# Patient Record
Sex: Female | Born: 1990 | Race: White | Hispanic: No | Marital: Married | Smoking: Never smoker
Health system: Southern US, Community
[De-identification: ages and names within clinical notes are randomized; demographics above are authoritative.]

## PROBLEM LIST (undated history)

## (undated) ENCOUNTER — Inpatient Hospital Stay: Payer: Self-pay

## (undated) DIAGNOSIS — Z789 Other specified health status: Secondary | ICD-10-CM

## (undated) DIAGNOSIS — Z1507 Genetic susceptibility to malignant neoplasm of urinary tract: Secondary | ICD-10-CM

## (undated) DIAGNOSIS — F32A Depression, unspecified: Secondary | ICD-10-CM

## (undated) DIAGNOSIS — Z1501 Genetic susceptibility to malignant neoplasm of breast: Secondary | ICD-10-CM

---
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
## 2010-07-10 ENCOUNTER — Ambulatory Visit (HOSPITAL_COMMUNITY)
Admission: RE | Admit: 2010-07-10 | Discharge: 2010-07-10 | Payer: Self-pay | Source: Home / Self Care | Attending: Family Medicine | Admitting: Family Medicine

## 2012-09-13 ENCOUNTER — Ambulatory Visit: Payer: Self-pay | Admitting: Nurse Practitioner

## 2012-09-16 ENCOUNTER — Encounter: Payer: Self-pay | Admitting: Nurse Practitioner

## 2012-09-16 ENCOUNTER — Ambulatory Visit (INDEPENDENT_AMBULATORY_CARE_PROVIDER_SITE_OTHER): Payer: BLUE CROSS/BLUE SHIELD | Admitting: Nurse Practitioner

## 2012-09-16 VITALS — BP 102/70 | HR 70 | Temp 98.8°F

## 2012-09-16 DIAGNOSIS — A499 Bacterial infection, unspecified: Secondary | ICD-10-CM

## 2012-09-16 DIAGNOSIS — N760 Acute vaginitis: Secondary | ICD-10-CM

## 2012-09-16 DIAGNOSIS — B9689 Other specified bacterial agents as the cause of diseases classified elsewhere: Secondary | ICD-10-CM

## 2012-09-16 DIAGNOSIS — N898 Other specified noninflammatory disorders of vagina: Secondary | ICD-10-CM

## 2012-09-16 LAB — POCT URINALYSIS DIPSTICK: pH, UA: 8

## 2012-09-16 LAB — POCT WET PREP (WET MOUNT): KOH Wet Prep POC: NEGATIVE

## 2012-09-16 MED ORDER — METRONIDAZOLE 500 MG PO TABS: ORAL_TABLET | ORAL | Status: DC

## 2012-09-16 NOTE — Patient Instructions (Signed)
Bacterial Vaginosis Bacterial vaginosis (BV) is a vaginal infection where the normal balance of bacteria in the vagina is disrupted. The normal balance is then replaced by an overgrowth of certain bacteria. There are several different kinds of bacteria that can cause BV. BV is the most common vaginal infection in women of childbearing age. CAUSES   The cause of BV is not fully understood. BV develops when there is an increase or imbalance of harmful bacteria.  Some activities or behaviors can upset the normal balance of bacteria in the vagina and put women at increased risk including:  Having a new sex partner or multiple sex partners.  Douching.  Using an intrauterine device (IUD) for contraception.  It is not clear what role sexual activity plays in the development of BV. However, women that have never had sexual intercourse are rarely infected with BV. Women do not get BV from toilet seats, bedding, swimming pools or from touching objects around them.  SYMPTOMS   Grey vaginal discharge.  A fish-like odor with discharge, especially after sexual intercourse.  Itching or burning of the vagina and vulva.  Burning or pain with urination.  Some women have no signs or symptoms at all. DIAGNOSIS  Your caregiver must examine the vagina for signs of BV. Your caregiver will perform lab tests and look at the sample of vaginal fluid through a microscope. They will look for bacteria and abnormal cells (clue cells), a pH test higher than 4.5, and a positive amine test all associated with BV.  RISKS AND COMPLICATIONS   Pelvic inflammatory disease (PID).  Infections following gynecology surgery.  Developing HIV.  Developing herpes virus. TREATMENT  Sometimes BV will clear up without treatment. However, all women with symptoms of BV should be treated to avoid complications, especially if gynecology surgery is planned. Female partners generally do not need to be treated. However, BV may spread  between female sex partners so treatment is helpful in preventing a recurrence of BV.   BV may be treated with antibiotics. The antibiotics come in either pill or vaginal cream forms. Either can be used with nonpregnant or pregnant women, but the recommended dosages differ. These antibiotics are not harmful to the baby.  BV can recur after treatment. If this happens, a second round of antibiotics will often be prescribed.  Treatment is important for pregnant women. If not treated, BV can cause a premature delivery, especially for a pregnant woman who had a premature birth in the past. All pregnant women who have symptoms of BV should be checked and treated.  For chronic reoccurrence of BV, treatment with a type of prescribed gel vaginally twice a week is helpful. HOME CARE INSTRUCTIONS   Finish all medication as directed by your caregiver.  Do not have sex until treatment is completed.  Tell your sexual partner that you have a vaginal infection. They should see their caregiver and be treated if they have problems, such as a mild rash or itching.  Practice safe sex. Use condoms. Only have 1 sex partner. PREVENTION  Basic prevention steps can help reduce the risk of upsetting the natural balance of bacteria in the vagina and developing BV:  Do not have sexual intercourse (be abstinent).  Do not douche.  Use all of the medicine prescribed for treatment of BV, even if the signs and symptoms go away.  Tell your sex partner if you have BV. That way, they can be treated, if needed, to prevent reoccurrence. SEEK MEDICAL CARE IF:     Your symptoms are not improving after 3 days of treatment.  You have increased discharge, pain, or fever. MAKE SURE YOU:   Understand these instructions.  Will watch your condition.  Will get help right away if you are not doing well or get worse. FOR MORE INFORMATION  Division of STD Prevention (DSTDP), Centers for Disease Control and Prevention:  www.cdc.gov/std American Social Health Association (ASHA): www.ashastd.org  Document Released: 06/15/2005 Document Revised: 09/07/2011 Document Reviewed: 12/06/2008 ExitCare Patient Information 2013 [employer].  

## 2012-09-16 NOTE — Progress Notes (Signed)
Subjective: Presents complaints of slight vaginal discharge with faint greenish color at times. Occasional itching. No fever. No pelvic pain. Married, same sexual partner. No dysuria. Was treated for BV on 2/19 with MetroGel. Symptoms had improved. Objective: NAD. Alert, oriented. Lungs clear. Heart regular rate rhythm. Abdomen soft nondistended nontender. EGBUS normal limit. Vagina minimal amount of white mucoid discharge noted. No CMT. Uterus and adnexa normal limit and nontender. Wet prep pH 4.5 with occasional clue cell noted. Assessment:Bacterial vaginosis - Plan: POCT Wet Prep [facility])  Vaginal discharge - Plan: POCT urinalysis dipstick, POCT Wet Prep [facility] [place])  Plan: Meds ordered this encounter  Medications  . DISCONTD: metroNIDAZOLE (METROGEL) 0.75 % vaginal gel    Sig:   . metroNIDAZOLE (FLAGYL) 500 MG tablet    Sig: Take one twice a day for 7 days.    Dispense:  14 tablet    Refill:  0    Order Specific Question:  Supervising Provider    Answer:  Merlyn Albert [[id number]]   call back if symptoms worsen or persist.

## 2013-06-27 ENCOUNTER — Telehealth: Payer: Self-pay | Admitting: Nurse Practitioner

## 2013-06-27 NOTE — Telephone Encounter (Signed)
Has appt Feb. 27, for pe with Eber Jones.  Is now taking Tri-Sprintec birth control pills.  Could you call her in enough to last her until she is seen. [facility].

## 2013-06-28 ENCOUNTER — Other Ambulatory Visit: Payer: Self-pay | Admitting: Nurse Practitioner

## 2013-06-28 MED ORDER — NORGESTIM-ETH ESTRAD TRIPHASIC 0.18/0.215/0.25 MG-35 MCG PO TABS: 1.0000 | ORAL_TABLET | Freq: Every day | ORAL | Status: DC

## 2013-08-25 ENCOUNTER — Encounter: Payer: BLUE CROSS/BLUE SHIELD | Admitting: Nurse Practitioner

## 2013-09-20 ENCOUNTER — Encounter: Payer: Self-pay | Admitting: Nurse Practitioner

## 2013-09-20 ENCOUNTER — Encounter: Payer: Self-pay | Admitting: Family Medicine

## 2013-09-20 ENCOUNTER — Ambulatory Visit (INDEPENDENT_AMBULATORY_CARE_PROVIDER_SITE_OTHER): Payer: BLUE CROSS/BLUE SHIELD | Admitting: Nurse Practitioner

## 2013-09-20 VITALS — BP 122/80 | Ht 61.25 in | Wt 115.5 lb

## 2013-09-20 DIAGNOSIS — Z0000 Encounter for general adult medical examination without abnormal findings: Secondary | ICD-10-CM

## 2013-09-20 DIAGNOSIS — Z113 Encounter for screening for infections with a predominantly sexual mode of transmission: Secondary | ICD-10-CM

## 2013-09-20 DIAGNOSIS — Z01419 Encounter for gynecological examination (general) (routine) without abnormal findings: Secondary | ICD-10-CM

## 2013-09-20 MED ORDER — NORGESTIM-ETH ESTRAD TRIPHASIC 0.18/0.215/0.25 MG-35 MCG PO TABS: 1.0000 | ORAL_TABLET | Freq: Every day | ORAL | Status: DC

## 2013-09-20 NOTE — Patient Instructions (Signed)
Check Gardasil 9   Human Papillomavirus Vaccine, Quadrivalent What is this medicine? HUMAN PAPILLOMAVIRUS VACCINE (HYOO muhn pap uh LOH muh vahy ruhs vak SEEN) is a vaccine. It is used to prevent infections of four types of the human papillomavirus. In women, the vaccine may lower your risk of getting cervical, vaginal, or anal cancer and genital warts. In men, the vaccine may lower your risk of getting genital warts and anal cancer. You cannot get these diseases from the vaccine. This vaccine does not treat these diseases. This medicine may be used for other purposes; ask your health care provider or pharmacist if you have questions. COMMON BRAND NAME(S): Gardasil What should I tell my health care provider before I take this medicine? They need to know if you have any of these conditions: -fever or infection -hemophilia -HIV infection or AIDS -immune system problems -low platelet count -an unusual reaction to Human Papillomavirus Vaccine, yeast, other medicines, foods, dyes, or preservatives -pregnant or trying to get pregnant -breast-feeding How should I use this medicine? This vaccine is for injection in a muscle on your upper arm or thigh. It is given by a health care professional. Dennis Bast will be observed for 15 minutes after each dose. Sometimes, fainting happens after the vaccine is given. You may be asked to sit or lie down during the 15 minutes. Three doses are given. The second dose is given 2 months after the first dose. The last dose is given 4 months after the second dose. A copy of a Vaccine Information Statement will be given before each vaccination. Read this sheet carefully each time. The sheet may change frequently. Talk to your pediatrician regarding the use of this medicine in children. While this drug may be prescribed for children as young as 13 years of age for selected conditions, precautions do apply. Overdosage: If you think you have taken too much of this medicine contact  a poison control center or emergency room at once. NOTE: This medicine is only for you. Do not share this medicine with others. What if I miss a dose? All 3 doses of the vaccine should be given within 6 months. Remember to keep appointments for follow-up doses. Your health care provider will tell you when to return for the next vaccine. Ask your health care professional for advice if you are unable to keep an appointment or miss a scheduled dose. What may interact with this medicine? -medicines that suppress your immune system like some medicines for cancer -steroid medicines like prednisone or cortisone -other vaccines This list may not describe all possible interactions. Give your health care provider a list of all the medicines, herbs, non-prescription drugs, or dietary supplements you use. Also tell them if you smoke, drink alcohol, or use illegal drugs. Some items may interact with your medicine. What should I watch for while using this medicine? This vaccine may not fully protect everyone. Continue to have regular pelvic exams and cervical or anal cancer screenings as directed by your doctor. The Human Papillomavirus is a sexually transmitted disease. It can be passed by any kind of sexual activity that involves genital contact. The vaccine works best when given before you have any contact with the virus. Many people who have the virus do not have any signs or symptoms. Tell your doctor or health care professional if you have any reaction or unusual symptom after getting the vaccine. What side effects may I notice from receiving this medicine? Side effects that you should report to your  doctor or health care professional as soon as possible: -allergic reactions like skin rash, itching or hives, swelling of the face, lips, or tongue -breathing problems -feeling faint or lightheaded, falls Side effects that usually do not require medical attention (report to your doctor or health care  professional if they continue or are bothersome): -cough -fever -redness, warmth, swelling, pain, or itching at site where injected This list may not describe all possible side effects. Call your doctor for medical advice about side effects. You may report side effects to FDA at 1-800-FDA-1088. Where should I keep my medicine? This drug is given in a hospital or clinic and will not be stored at home. NOTE: This sheet is a summary. It may not cover all possible information. If you have questions about this medicine, talk to your doctor, pharmacist, or health care provider.  2014, Elsevier/Gold Standard. (2009-06-20 11:52:23) Human Papillomavirus Human papillomavirus (HPV) is the most common sexually transmitted infection (STI) and is highly contagious. HPV infections cause genital warts and cancers to the outlet of the womb (cervix), birth canal (vagina), opening of the birth canal (vulva), and anus. There are over 100 types of HPV. Four types of HPV are responsible for causing 70% of all cervical cancers. Ninety percent of anal cancers and genital warts are caused by HPV. Unless you have wart-like lesions in the throat or genital warts that you can see or feel, HPV usually does not cause symptoms. Therefore, people can be infected for long periods and pass it on to others without knowing it. HPV in pregnancy usually does not cause a problem for the mother or baby. If the mother has genital warts, the baby rarely gets infected. When the HPV infection is found to be pre-cancerous on the cervix, vagina, or vulva, the mother will be followed closely during the pregnancy. Any needed treatment will be done after the baby is born. CAUSES   Having unprotected sex. HPV can be spread by oral, vaginal, or anal sexual activity.  Having several sex partners.  Having a sex partner who has other sex partners.  Having or having had another sexually transmitted infection. SYMPTOMS   More than 90% of people  carrying HPV cannot tell anything is wrong.  Wart-like lesions in the throat (from having oral sex).  Warts in the infected skin or mucous membranes.  Genital warts may itch, burn, or bleed.  Genital warts may be painful or bleed during sexual intercourse. DIAGNOSIS   Genital warts are easily seen with the naked eye.  Currently, there is no FDA-approved test to detect HPV in males.  In females, a Pap test can show cells which are infected with HPV.  In females, a scope can be used to view the cervix (colposcopy). A colposcopy can be performed if the pelvic exam or Pap test is abnormal.  In females, a sample of tissue may be removed (biopsy) during the colposcopy. TREATMENT   Treatment of genital warts can include:  Podophyllin lotion or gel.  Bichloroacetic acid (BCA) or trichloroacetic acid (TCA).  Podofilox solution or gel.  Imiquimod cream.  Interferon injections.  Use of a probe to apply extreme cold (cryotherapy).  Application of an intense beam of light (laser treatment).  Use of a probe to apply extreme heat (electrocautery).  Surgery.  HPV of the cervix, vagina, or vulva can be treated with:  Cryotherapy.  Laser treatment.  Electrocautery.  Surgery. Your caregiver will follow you closely after you are treated. This is because the HPV  can come back and may need treatment again. HOME CARE INSTRUCTIONS   Follow your caregiver's instructions regarding medications, Pap tests, and follow-up exams.  Do not touch or scratch the warts.  Do not treat genital warts with medication used for treating hand warts.  Tell your sex partner about your infection because he or she may also need treatment.  Do not have sex while you are being treated.  After treatment, use condoms during sex to prevent future infections.  Have only 1 sex partner.  Have a sex partner who does not have other sex partners.  Use over-the-counter creams for itching or irritation as  directed by your caregiver.  Use over-the-counter or prescription medicines for pain, discomfort, or fever as directed by your caregiver.  Do not douche or use tampons during treatment of HPV. PREVENTION   Talk to your caregiver about getting the HPV vaccines. These vaccines prevent some HPV infections and cancers. It is recommended that the vaccine be given to males and females between the age of 32 and 60 years old. It will not work if you already have HPV and it is not recommended for pregnant women. The vaccines are not recommended for pregnant women.  Call your caregiver if you think you are pregnant and have the HPV.  A PAP test is done to screen for cervical cancer.  The first PAP test should be done at age 6.  Between ages 30 and 45, PAP tests are repeated every 2 years.  Beginning at age 14, you are advised to have a PAP test every 3 years as long as your past 3 PAP tests have been normal.  Some women have medical problems that increase the chance of getting cervical cancer. Talk to your caregiver about these problems. It is especially important to talk to your caregiver if a new problem develops soon after your last PAP test. In these cases, your caregiver may recommend more frequent screening and Pap tests.  The above recommendations are the same for women who have or have not gotten the vaccine for HPV (Human Papillomavirus).  If you had a hysterectomy for a problem that was not a cancer or a condition that could lead to cancer, then you no longer need Pap tests. However, even if you no longer need a PAP test, a regular exam is a good idea to make sure no other problems are starting.   If you are between ages 24 and 83, and you have had normal Pap tests going back 10 years, you no longer need Pap tests. However, even if you no longer need a PAP test, a regular exam is a good idea to make sure no other problems are starting.  If you have had past treatment for cervical cancer  or a condition that could lead to cancer, you need Pap tests and screening for cancer for at least 20 years after your treatment.  If Pap tests have been discontinued, risk factors (such as a new sexual partner)need to be re-assessed to determine if screening should be resumed.  Some women may need screenings more often if they are at high risk for cervical cancer. SEEK MEDICAL CARE IF:   The treated skin becomes red, swollen or painful.  You have an oral temperature above 102 F (38.9 C).  You feel generally ill.  You feel lumps or pimple-like projections in and around your genital area.  You develop bleeding of the vagina or the treatment area.  You develop painful sexual  intercourse. Document Released: 09/05/2003 Document Revised: 09/07/2011 Document Reviewed: 08/25/2007 [facility] Patient Information 2014 [place].

## 2013-09-21 LAB — GC/CHLAMYDIA PROBE AMP, URINE
Chlamydia, Swab/Urine, PCR: NEGATIVE
GC PROBE AMP, URINE: NEGATIVE

## 2013-09-23 ENCOUNTER — Encounter: Payer: Self-pay | Admitting: Nurse Practitioner

## 2013-09-23 NOTE — Progress Notes (Signed)
   Subjective:    Patient ID: Brianna Steele, female    DOB: [birth year], 23 y.o.   MRN: [id number]  HPI presents for wellness checkups. Regular cycles, normal flow. No new partners. No vaginal discharge or pelvic pain. No visual problems. Needs updated dental exam. Regular exercise. Overall healthy diet.    Review of Systems  Constitutional: Negative for activity change, appetite change and fatigue.  HENT: Negative for dental problem, ear pain, sinus pressure and sore throat.   Respiratory: Negative for cough, chest tightness, shortness of breath and wheezing.   Cardiovascular: Negative for chest pain and leg swelling.  Gastrointestinal: Negative for nausea, vomiting, abdominal pain, diarrhea and constipation.  Genitourinary: Negative for dysuria, urgency, frequency, vaginal discharge, enuresis, difficulty urinating, genital sores, menstrual problem and pelvic pain.       Objective:   Physical Exam  Vitals reviewed. Constitutional: She is oriented to person, place, and time. She appears well-developed. No distress.  HENT:  Right Ear: External ear normal.  Left Ear: External ear normal.  Mouth/Throat: Oropharynx is clear and moist.  Neck: Normal range of motion. Neck supple. No tracheal deviation present. No thyromegaly present.  Cardiovascular: Normal rate, regular rhythm and normal heart sounds.  Exam reveals no gallop.   No murmur heard. Pulmonary/Chest: Effort normal and breath sounds normal.  Abdominal: Soft. She exhibits no distension. There is no tenderness.  Genitourinary: Vagina normal and uterus normal. No vaginal discharge found.  External GU no rashes or lesions noted. Vagina no discharge. Cervix normal limit in appearance, no CMT. Bimanual exam no masses or tenderness noted.  Musculoskeletal: She exhibits no edema.  Lymphadenopathy:    She has no cervical adenopathy.  Neurological: She is alert and oriented to person, place, and time.  Skin: Skin is warm and dry. No  rash noted.  Psychiatric: She has a normal mood and affect. Her behavior is normal.   Breast exam: No masses noted, dense tissue. Axilla no adenopathy.        Assessment & Plan:  Well woman exam  Screen for STD (sexually transmitted disease) - Plan: GC/chlamydia probe amp, urine  routine screening Plan: Meds ordered this encounter  Medications  . Norgestimate-Ethinyl Estradiol Triphasic 0.18/0.215/0.25 MG-35 MCG tablet    Sig: Take 1 tablet by mouth daily.    Dispense:  1 Package    Refill:  11    Order Specific Question:  Supervising Provider    Answer:  Mikey Kirschner [[id number]]   discussed safe sex issues. Recommend healthy diet regular activity. Daily vitamin D and calcium. Next physical in one year.

## 2014-08-28 ENCOUNTER — Other Ambulatory Visit: Payer: Self-pay | Admitting: Nurse Practitioner

## 2014-09-28 ENCOUNTER — Other Ambulatory Visit: Payer: Self-pay | Admitting: Family Medicine

## 2014-10-08 ENCOUNTER — Ambulatory Visit (INDEPENDENT_AMBULATORY_CARE_PROVIDER_SITE_OTHER): Payer: 59 | Admitting: Nurse Practitioner

## 2014-10-08 ENCOUNTER — Encounter: Payer: Self-pay | Admitting: Nurse Practitioner

## 2014-10-08 ENCOUNTER — Encounter: Payer: Self-pay | Admitting: Family Medicine

## 2014-10-08 VITALS — BP 118/86 | Ht 61.0 in | Wt 116.0 lb

## 2014-10-08 DIAGNOSIS — Z124 Encounter for screening for malignant neoplasm of cervix: Secondary | ICD-10-CM | POA: Diagnosis not present

## 2014-10-08 DIAGNOSIS — Z01419 Encounter for gynecological examination (general) (routine) without abnormal findings: Secondary | ICD-10-CM

## 2014-10-08 DIAGNOSIS — Z0000 Encounter for general adult medical examination without abnormal findings: Secondary | ICD-10-CM | POA: Diagnosis not present

## 2014-10-08 DIAGNOSIS — Z1151 Encounter for screening for human papillomavirus (HPV): Secondary | ICD-10-CM | POA: Diagnosis not present

## 2014-10-08 DIAGNOSIS — Z113 Encounter for screening for infections with a predominantly sexual mode of transmission: Secondary | ICD-10-CM

## 2014-10-08 MED ORDER — NORGESTIM-ETH ESTRAD TRIPHASIC 0.18/0.215/0.25 MG-35 MCG PO TABS: ORAL_TABLET | ORAL | Status: DC

## 2014-10-10 LAB — PAP IG, CT-NG NAA, HPV HIGH-RISK
CHLAMYDIA, NUC. ACID AMP: NEGATIVE
Gonococcus by Nucleic Acid Amp: NEGATIVE
HPV, high-risk: NEGATIVE
PAP SMEAR COMMENT: 0

## 2014-10-13 ENCOUNTER — Encounter: Payer: Self-pay | Admitting: Nurse Practitioner

## 2014-10-13 NOTE — Progress Notes (Signed)
   Subjective:    Patient ID: Brianna Steele, female    DOB: [birth year], 24 y.o.   MRN: [id number]  HPI presents for her wellness exam. Regular cycles, normal flow. Needs dental exam. Same sexual partner. Fairly healthy diet. Active.     Review of Systems  Constitutional: Negative for fever, activity change, appetite change and fatigue.  HENT: Negative for dental problem, ear pain, sinus pressure and sore throat.   Respiratory: Negative for cough, chest tightness, shortness of breath and wheezing.   Cardiovascular: Negative for chest pain.  Gastrointestinal: Negative for nausea, vomiting, abdominal pain, diarrhea, constipation and abdominal distention.  Genitourinary: Negative for dysuria, urgency, frequency, vaginal discharge, enuresis, difficulty urinating, genital sores, menstrual problem and pelvic pain.       Objective:   Physical Exam  Constitutional: She is oriented to person, place, and time. She appears well-developed. No distress.  HENT:  Right Ear: External ear normal.  Left Ear: External ear normal.  Mouth/Throat: Oropharynx is clear and moist.  Neck: Normal range of motion. Neck supple. No tracheal deviation present. No thyromegaly present.  Cardiovascular: Normal rate, regular rhythm and normal heart sounds.  Exam reveals no gallop.   No murmur heard. Pulmonary/Chest: Effort normal and breath sounds normal.  Abdominal: Soft. She exhibits no distension. There is no tenderness.  Genitourinary: Vagina normal and uterus normal. No vaginal discharge found.  External GU: no rashes or lesions. Vagina: no discharge. Cervix normal in appearance; no CMT. Bimanual exam: no tenderness or obvious masses.   Musculoskeletal: She exhibits no edema.  Lymphadenopathy:    She has no cervical adenopathy.  Neurological: She is alert and oriented to person, place, and time.  Skin: Skin is warm and dry. No rash noted.  Psychiatric: She has a normal mood and affect. Her behavior is normal.    Vitals reviewed.         Assessment & Plan:  Well woman exam - Plan: Pap IG, CT/NG NAA, and HPV (high risk)  Screening for cervical cancer - Plan: Pap IG, CT/NG NAA, and HPV (high risk)  Screen for STD (sexually transmitted disease) - Plan: Pap IG, CT/NG NAA, and HPV (high risk)  Screening for HPV (human papillomavirus) - Plan: Pap IG, CT/NG NAA, and HPV (high risk)  Recommend healthy diet and regular dental care. Return in about 1 year (around 10/08/2015).

## 2015-04-16 ENCOUNTER — Telehealth: Payer: Self-pay | Admitting: Family Medicine

## 2015-04-16 NOTE — Telephone Encounter (Signed)
error 

## 2015-04-22 ENCOUNTER — Telehealth: Payer: Self-pay | Admitting: Family Medicine

## 2015-04-22 DIAGNOSIS — Z139 Encounter for screening, unspecified: Secondary | ICD-10-CM

## 2015-04-22 NOTE — Telephone Encounter (Signed)
Work # [phone] Cell # [phone]

## 2015-04-22 NOTE — Telephone Encounter (Addendum)
Blood work orders placed in [employer].

## 2015-04-22 NOTE — Telephone Encounter (Signed)
Left message to return call 

## 2015-04-22 NOTE — Telephone Encounter (Signed)
Pt was home schooled, chart does not show she had a Hep B For her job they are needing to know if she has had this vaccine  Can we order a Titer for her?   She has tried to reach her Doctor that she had originally in [facility]  But the office is no longer open.   Please advise

## 2015-04-22 NOTE — Telephone Encounter (Signed)
Hepatitis B surface antibody, please run tests thank you-may end up needing hepatitis B series through the health department.

## 2015-04-23 NOTE — Telephone Encounter (Signed)
Patient notified

## 2015-04-28 LAB — HEPATITIS B SURFACE ANTIBODY,QUALITATIVE: Hep B Surface Ab, Qual: NONREACTIVE

## 2018-03-29 ENCOUNTER — Ambulatory Visit: Payer: Managed Care, Other (non HMO) | Admitting: Family Medicine

## 2018-03-29 ENCOUNTER — Encounter: Payer: Self-pay | Admitting: Family Medicine

## 2018-03-29 VITALS — BP 122/70 | Ht 61.0 in | Wt 111.2 lb

## 2018-03-29 DIAGNOSIS — N926 Irregular menstruation, unspecified: Secondary | ICD-10-CM | POA: Diagnosis not present

## 2018-03-29 DIAGNOSIS — N912 Amenorrhea, unspecified: Secondary | ICD-10-CM

## 2018-03-29 LAB — POCT URINE PREGNANCY: PREG TEST UR: POSITIVE — AB

## 2018-03-29 NOTE — Progress Notes (Signed)
   Subjective:    Patient ID: Brianna Steele, female    DOB: [birth year], 27 y.o.   MRN: [id number]  HPI Pt here today for pregnancy test. Pt had last period in August. Results for orders placed or performed in visit on 03/29/18  POCT urine pregnancy  Result Value Ref Range   Preg Test, Ur Positive (A) Negative  Patient here today because of being pregnant She wanted advice on what she should be doing at this point We talked at length about the importance of avoiding all anti-inflammatories She is not taking her birth control She is going to take a prenatal vitamin daily She denies any abdominal pains or bleeding is will be seeing a gynecologist OB in the future  Review of Systems     Objective:   Physical Exam Lungs clear heart regular       Assessment & Plan:  Pregnancy She was congratulated Recommend for the patient to be seen by OB If she needs her help setting this up she will notify us She is living in [facility] She will establish down that way with OB/GYN and pediatrics

## 2018-04-04 ENCOUNTER — Telehealth: Payer: Self-pay

## 2018-04-04 NOTE — Telephone Encounter (Signed)
Patient is calling today stating she is 6-[redacted] weeks pregnant and has not established a provider for OBGYN needs. She states for the last few days she has been nauseous no vomiting. She states she ate Ice cream on Friday and that is when it started. She states she has been taking a prenatal vit. Please advise. Pt uses Insurance underwriter in [place] on [address]).

## 2018-04-05 NOTE — Telephone Encounter (Signed)
Tried calling again vm not set up.

## 2018-04-05 NOTE — Telephone Encounter (Signed)
Nurses-please talk with patient Typically eating small amounts intermittently throughout the day is the best approach Avoid fried and fatty foods and foods that typically increase regurgitation including tomato based products such as pizza or spaghetti, also minimize caffeine's If still having significant nausea we can use a prescription medication called Diclegis This particular medication is approved by the FDA for treatment of nausea associated with pregnancy  If the patient would like a medication called in please let us know As the patient decided on OB at this point? We can certainly touch base with the OB office regarding this issue as well on her behalf if we know who she would like to see thank you

## 2018-04-05 NOTE — Telephone Encounter (Signed)
Tried calling pt picked up and hung up the phone. I tried calling back no answer.

## 2018-04-11 NOTE — Telephone Encounter (Signed)
Called mobile number and asked for Sariah and the lady that answered hung up on me. Called the home number and left a message to return call

## 2018-04-22 ENCOUNTER — Telehealth: Payer: Self-pay | Admitting: Family Medicine

## 2018-04-22 NOTE — Telephone Encounter (Signed)
Patient is aware 

## 2018-04-22 NOTE — Telephone Encounter (Signed)
Nurses-please call the patient's OB clinic.  As to let us speak with 1 of the nurses regarding this patient.  Please run the situation by them.  I do not mind prescribing Zofran for the patient is long and is OB clinic allows for their patients to utilize this for nausea

## 2018-04-22 NOTE — Telephone Encounter (Signed)
Discussed with pt. Pt does want to rx called in for nausea. She will be seeing ob for the first time on Wednesday at the [facility]. She states nausea is worse now and would like the med and a call back after we send [facility]. [facility] that is listed below in message. We was not able to get in touch with pt because number was wrong in chart she called back today.

## 2018-04-22 NOTE — Telephone Encounter (Signed)
Contacted patient and asked if she has an OBGYN yet; pt states she sees them Wednesday. Pt informed to call the OBGYN to have any pregnancy questions answered. Pt states she has been having problems with allergies lately.

## 2018-04-22 NOTE — Telephone Encounter (Signed)
Husband calling with some general pregnancy questions about wife who was recently here for pregnancy test.  I told him we didn't have a DPR on file to be able to talk with him and he said wife was there with him and would answer.  We had the wrong cell #, correct one is [phone]

## 2018-04-22 NOTE — Telephone Encounter (Signed)
Typically Claritin is safe with pregnancy but she can verify this with her OBs office

## 2018-04-22 NOTE — Telephone Encounter (Signed)
Patient states she will stick with the natural remedies for now.

## 2018-04-22 NOTE — Telephone Encounter (Signed)
Contacted OB clinic and spoke with [facility]. Brianna Steele states that Zofran is fine. She spoke with the nurse about Diclegis and the nurse states that she is not sure if insurance would cover this med. She also states that the nurse recommends trying natural remedies first such as B6 vitamin, Unisom, peppermint or ginger but if provider would like to prescribe Zofran that would be fine.

## 2018-04-22 NOTE — Telephone Encounter (Signed)
Nurses-please relay the natural remedies that could be tried But if the patient would like Zofran let her know that her OB office said that would be fine Zofran 8 mg tablet 1 3 times daily as needed nausea, #15 with 1 refill She would be able to get further prescriptions from her gynecologist when she sees

## 2018-04-28 ENCOUNTER — Other Ambulatory Visit: Payer: Self-pay | Admitting: Obstetrics and Gynecology

## 2018-04-28 DIAGNOSIS — Z369 Encounter for antenatal screening, unspecified: Secondary | ICD-10-CM

## 2018-05-16 ENCOUNTER — Ambulatory Visit: Payer: Managed Care, Other (non HMO) | Admitting: Family Medicine

## 2018-05-16 ENCOUNTER — Telehealth: Payer: Self-pay | Admitting: Obstetrics and Gynecology

## 2018-05-16 ENCOUNTER — Encounter: Payer: Self-pay | Admitting: Family Medicine

## 2018-05-16 VITALS — BP 118/68 | Temp 98.3°F | Ht 61.0 in | Wt 111.8 lb

## 2018-05-16 DIAGNOSIS — J069 Acute upper respiratory infection, unspecified: Secondary | ICD-10-CM

## 2018-05-16 NOTE — Telephone Encounter (Signed)
Ms. Brianna Steele was scheduled for genetic counseling and an ultrasound for first trimester screening in this twin gestation on 05/19/2018. She called and requested to cancel this visit, as she and her husband decided that they are not interested in genetic testing or screening for this pregnancy.  If her OB would like to refer her back to [facility] for any reason, we may be reached at [phone].  Wilburt Finlay, MS, CGC

## 2018-05-16 NOTE — Progress Notes (Signed)
   Subjective:    Patient ID: Brianna Steele, female    DOB: [birth year], 27 y.o.   MRN: [id number]  Sinusitis  This is a new problem. Episode onset: nov 13th. Associated symptoms include congestion, coughing, ear pain, headaches and a sore throat. Pertinent negatives include no shortness of breath.   Vomiting due to pregnancy. Pt is 3 months pregnant with twins.   Reports congestion and cold symptoms have been worse at night. Ear pain worse to left ear. Denies fever. Has not tried taking anything for it yet. Reports cough is typically non-productive. Nose productive of yellow/green phlegm. Over the last 5-6 days states congestion has gotten worse, but sore throat has resolved.   Review of Systems  Constitutional: Negative for fever.  HENT: Positive for congestion, ear pain and sore throat.   Eyes: Negative for discharge.  Respiratory: Positive for cough. Negative for shortness of breath and wheezing.   Neurological: Positive for headaches.       Objective:   Physical Exam  Constitutional: She is oriented to person, place, and time. She appears well-developed and well-nourished. No distress.  HENT:  Head: Normocephalic and atraumatic.  Right Ear: Tympanic membrane normal.  Left Ear: Tympanic membrane normal.  Nose: No sinus tenderness.  Mouth/Throat: Uvula is midline and oropharynx is clear and moist.  Eyes: Right eye exhibits no discharge. Left eye exhibits no discharge.  Neck: Neck supple.  Cardiovascular: Normal rate, regular rhythm and normal heart sounds.  Pulmonary/Chest: Effort normal and breath sounds normal. No respiratory distress. She has no wheezes.  Lymphadenopathy:    She has no cervical adenopathy.  Neurological: She is alert and oriented to person, place, and time.  Skin: Skin is warm and dry.  Psychiatric: She has a normal mood and affect.  Nursing note and vitals reviewed.     Assessment & Plan:  Viral URI  Do not feel abx is needed at this time. Would like  to take a more conservative treatment approach especially given pregnancy. She should call us in a couple days to let us know how she is doing, if symptoms are worsening we can call in an abx at that time. Continue symptomatic care with saline nasal spray and humidifier.   Dr. Sallee Lange was consulted on this case and is in agreement with the above treatment plan.  As attending physician to this patient visit, this patient was seen in conjunction with the nurse practitioner.  The history,physical and treatment plan was reviewed with the nurse practitioner and pertinent findings were verified with the patient.  Also the treatment plan was reviewed with the patient while they were present. SAL

## 2018-05-19 ENCOUNTER — Ambulatory Visit: Payer: Self-pay

## 2018-05-23 ENCOUNTER — Telehealth: Payer: Self-pay | Admitting: Family Medicine

## 2018-05-23 NOTE — Telephone Encounter (Signed)
FYI- Patient was seen 11/18 with a virus and was told to call on today to let the doctor or Brianna Steele know how she was doing. Patient states feeling much better and wont be needing anything called in at this time.

## 2018-05-25 ENCOUNTER — Inpatient Hospital Stay: Admission: RE | Admit: 2018-05-25 | Payer: Managed Care, Other (non HMO)

## 2018-08-05 ENCOUNTER — Other Ambulatory Visit: Payer: Self-pay

## 2018-08-05 ENCOUNTER — Observation Stay
Admission: EM | Admit: 2018-08-05 | Discharge: 2018-08-05 | Disposition: A | Discharge status: Home / Self Care | Payer: Managed Care, Other (non HMO) | Attending: Certified Nurse Midwife | Admitting: Certified Nurse Midwife

## 2018-08-05 ENCOUNTER — Encounter: Payer: Self-pay | Admitting: Certified Nurse Midwife

## 2018-08-05 DIAGNOSIS — O26892 Other specified pregnancy related conditions, second trimester: Principal | ICD-10-CM | POA: Insufficient documentation

## 2018-08-05 DIAGNOSIS — Y93E1 Activity, personal bathing and showering: Secondary | ICD-10-CM | POA: Insufficient documentation

## 2018-08-05 DIAGNOSIS — Z3A24 24 weeks gestation of pregnancy: Secondary | ICD-10-CM | POA: Diagnosis not present

## 2018-08-05 DIAGNOSIS — W182XXA Fall in (into) shower or empty bathtub, initial encounter: Secondary | ICD-10-CM | POA: Diagnosis not present

## 2018-08-05 DIAGNOSIS — Z7982 Long term (current) use of aspirin: Secondary | ICD-10-CM | POA: Diagnosis not present

## 2018-08-05 DIAGNOSIS — W19XXXA Unspecified fall, initial encounter: Secondary | ICD-10-CM | POA: Diagnosis present

## 2018-08-05 HISTORY — DX: Other specified health status: Z78.9

## 2018-08-05 MED ORDER — ACETAMINOPHEN 325 MG PO TABS: 650.0000 mg | ORAL_TABLET | ORAL | Status: DC | PRN

## 2018-08-05 NOTE — OB Triage Note (Signed)
Provider reviewed NST and pt c/o. Discharge instructions reviewed with patient and pt has no further questions at this time. Pt instructed on when to return to hospital and to keep her next OB appointment. Pt discharged in stable condition with her mother.

## 2018-08-05 NOTE — Discharge Summary (Signed)
   Brianna Steele is a 28 y.o. female. She is at [redacted]w[redacted]d gestation. Patient's last menstrual period was 02/16/2018. Estimated Date of Delivery: 11/23/18  Prenatal care site: [facility]   Chief complaint: fall at home Location: shower Onset/timing: yesterday around 2130 Duration: once Quality: fell, did not land on abdomen Severity: n/a Aggravating or alleviating conditions: n/a Associated signs/symptoms: none Context: Brianna Steele reports that she fell in the shower yesterday evening around 2130. She fell on her bottom and did not hit her stomach. She does not have any pain currently. She does feel like the babies are in a different position than they were, but they are moving regularly.   S: Resting comfortably.   She reports:  -active fetal movement -no leakage of fluid -no vaginal bleeding -no contractions  Maternal Medical History:   Past Medical History:  Diagnosis Date  . Medical history non-contributory     Past Surgical History:  Procedure Laterality Date  . TONSILLECTOMY AND ADENOIDECTOMY  04/1998    No Known Allergies  Prior to Admission medications   Medication Sig Start Date End Date Taking? Authorizing Provider  aspirin EC 81 MG tablet Take 81 mg by mouth daily.   Yes [provider]  Multiple Vitamin (MULTI VITAMIN DAILY PO) Take by mouth. flintstone vit   Yes [provider]  Pyridoxine HCl (B-6 PO) Take by mouth 2 (two) times daily.   Yes [provider]     Social History: She  reports that she has never smoked. She has never used smokeless tobacco. She reports that she does not drink alcohol or use drugs.  Family History: family history includes Cancer in her paternal grandmother; Cancer (age of onset: 35) in her father and mother; Heart disease in her paternal grandfather; Kidney Stones in an other family member.   Review of Systems: A full review of systems was performed and negative except as noted in the HPI.     O:  BP (!) 112/59 (BP Location: Right Arm)   Pulse 90   Temp 97.9 F (36.6 C) (Oral)   Resp 15   Ht 5\' 1"  (1.549 m)   Wt 58.5 kg   LMP 02/16/2018   BMI 24.37 kg/m  No results found for this or any previous visit (from the past 48 hour(s)).   Constitutional: NAD, AAOx3  HE/ENT: extraocular movements grossly intact, moist mucous membranes CV: RRR PULM: normal respiratory effort, CTABL     Abd: gravid, non-tender, non-distended, soft      Ext: Non-tender, Nonedmeatous   Psych: mood appropriate, speech normal Pelvic deferred  NST/monitoring:  Baby A: Baseline: 140bpm Variability: moderate Accelerations: 10x10s present Decelerations: absent  Baby B: Baseline: 140bpm Variability: moderate Accelerations: 10x10s present Decelerations: absent  Time: 16mins Toco: quiet   A/P: 28 y.o. [redacted]w[redacted]d here for antenatal surveillance during pregnancy.  Principle diagnosis: fall during pregnancy  Labor  Not present  Fetal Wellbeing  Reassuring for GA  Fall  No direct trauma to abdomen, no bleeding, no contractions, regular fetal movement.   D/c home stable, precautions reviewed, follow-up as scheduled.    Lisette Grinder 08/05/2018 11:54 AM  ----- Lisette Grinder, CNM Certified Nurse Midwife [facility], Department of [facility]

## 2018-08-05 NOTE — OB Triage Note (Signed)
Pt is a 28 y.o G1P0 at [redacted]w[redacted]d that presents from home with c/o fall yesterday evening around 2130. Pt fell on her bottom in the shower and denies hitting her stomach. Pt is pregnant with twins and denies VB, LOF, states positive FM. Monitoors applied and assessing Baby A fht 140, Baby B fht 135. Pt denies any other complications with this pregnancy. Vitals WDL will continue to monitor

## 2018-08-09 ENCOUNTER — Observation Stay
Admission: AD | Admit: 2018-08-09 | Discharge: 2018-08-09 | Disposition: A | Discharge status: Home / Self Care | Payer: Managed Care, Other (non HMO) | Attending: Certified Nurse Midwife | Admitting: Certified Nurse Midwife

## 2018-08-09 ENCOUNTER — Encounter: Payer: Self-pay | Admitting: *Deleted

## 2018-08-09 ENCOUNTER — Other Ambulatory Visit: Payer: Self-pay

## 2018-08-09 ENCOUNTER — Observation Stay: Payer: Managed Care, Other (non HMO)

## 2018-08-09 ENCOUNTER — Emergency Department
Admission: EM | Admit: 2018-08-09 | Discharge: 2018-08-09 | Discharge status: Against Medical Advice | Payer: Managed Care, Other (non HMO) | Source: Home / Self Care

## 2018-08-09 ENCOUNTER — Encounter: Payer: Self-pay | Admitting: Certified Nurse Midwife

## 2018-08-09 DIAGNOSIS — R109 Unspecified abdominal pain: Secondary | ICD-10-CM | POA: Diagnosis present

## 2018-08-09 DIAGNOSIS — Z7982 Long term (current) use of aspirin: Secondary | ICD-10-CM | POA: Insufficient documentation

## 2018-08-09 DIAGNOSIS — M25561 Pain in right knee: Secondary | ICD-10-CM | POA: Diagnosis present

## 2018-08-09 DIAGNOSIS — W19XXXA Unspecified fall, initial encounter: Secondary | ICD-10-CM | POA: Diagnosis not present

## 2018-08-09 DIAGNOSIS — Z3A24 24 weeks gestation of pregnancy: Secondary | ICD-10-CM | POA: Diagnosis not present

## 2018-08-09 DIAGNOSIS — O4702 False labor before 37 completed weeks of gestation, second trimester: Principal | ICD-10-CM | POA: Insufficient documentation

## 2018-08-09 DIAGNOSIS — O26892 Other specified pregnancy related conditions, second trimester: Secondary | ICD-10-CM | POA: Diagnosis present

## 2018-08-09 MED ORDER — TERBUTALINE SULFATE 1 MG/ML IJ SOLN
INTRAMUSCULAR | Status: AC
  Administered 2018-08-09: 0.25 mg
  Filled 2018-08-09: qty 1

## 2018-08-09 MED ORDER — LACTATED RINGERS IV BOLUS
1000.0000 mL | Freq: Once | INTRAVENOUS | Status: AC
  Administered 2018-08-09: 1000 mL via INTRAVENOUS

## 2018-08-09 MED ORDER — ACETAMINOPHEN 325 MG PO TABS: 650.0000 mg | ORAL_TABLET | ORAL | Status: DC | PRN

## 2018-08-09 MED ORDER — TERBUTALINE SULFATE 1 MG/ML IJ SOLN: 0.2500 mg | Freq: Once | INTRAMUSCULAR | Status: DC

## 2018-08-09 MED ORDER — TERBUTALINE SULFATE 1 MG/ML IJ SOLN
0.2500 mg | Freq: Once | INTRAMUSCULAR | Status: AC | PRN
  Administered 2018-08-09: 0.25 mg via SUBCUTANEOUS

## 2018-08-09 NOTE — ED Triage Notes (Signed)
Pt to ED after a fall. Pt fell from standing and hit head on the door. No LOC. Small amount of swelling noted to the left sdie of the forehead. No bruising. No changes in vision or neuro deficits at this time. Pt is [redacted] weeks pregnant but has already been cleared by labor and delivery.

## 2018-08-09 NOTE — Discharge Summary (Addendum)
Brianna Steele is a 28 y.o. female. She is at [redacted]w[redacted]d gestation. Patient's last menstrual period was 02/16/2018. Estimated Date of Delivery: 11/23/18  Prenatal care site: [facility]  Chief complaint: fall at home Location: bedroom at room Onset/timing: today at 1510 Duration: once Quality: fell Severity: n/a Aggravating or alleviating conditions: n/a Associated signs/symptoms: none Context: [name] reports that she was taking a nap this afternoon when she got up abruptly to get something out of the dryer (the noise woke her up). When she got up to go to the laundry room, she tripped and fell. She landed on her right knee and then fell to the left, where she hit the left side of her body and her head on the door frame. She reports no loss of consciousness. She reports some right knee pain and left arm/wrist pain. She has had some abdominal cramping and tightening.   S: Resting comfortably.   She reports:  -active fetal movement -no leakage of fluid -no vaginal bleeding -no contractions, but she has had some abdominal cramping  Maternal Medical History:   Past Medical History:  Diagnosis Date  . Medical history non-contributory     Past Surgical History:  Procedure Laterality Date  . TONSILLECTOMY AND ADENOIDECTOMY  04/1998    No Known Allergies  Prior to Admission medications   Medication Sig Start Date End Date Taking? Authorizing Provider  aspirin EC 81 MG tablet Take 81 mg by mouth daily.   Yes [provider]  Multiple Vitamin (MULTI VITAMIN DAILY PO) Take by mouth. flintstone vit   Yes [provider]  Pyridoxine HCl (B-6 PO) Take by mouth 2 (two) times daily.   Yes [provider]     Social History: She  reports that she has never smoked. She has never used smokeless tobacco. She reports that she does not drink alcohol or use drugs.  Family History: family history includes Cancer in her paternal grandmother; Cancer (age of onset:  51) in her father and mother; Heart disease in her paternal grandfather; Kidney Stones in an other family member.   Review of Systems: A full review of systems was performed and negative except as noted in the HPI.     O:  BP 108/73 (BP Location: Right Arm)   Temp 98.3 F (36.8 C) (Oral)   Resp 18   LMP 02/16/2018   SpO2 100%  No results found for this or any previous visit (from the past 48 hour(s)).   Constitutional: NAD, AAOx3  HE/ENT: extraocular movements grossly intact, moist mucous membranes CV: RRR PULM: normal respiratory effort, CTABL     Abd: gravid, mild abdominal tenderness, non-distended, soft    Ext: Non-tender, Nonedmeatous   Psych: mood appropriate, speech normal Pelvic: 1cm/long/high posterior/medium, no change over 3 hours  NST:  Baby A: Baseline:150bpm Variability: moderate Accelerations: present Decelerations: absent Interpretation: reactive  Baby B: Baseline:140bpm Variability: moderate Accelerations: present Decelerations: absent Interpretation: reactive  Toco: contractions initially every 2-3 minutes, now absent   A/P: 28 y.o. 109w6d here for antenatal surveillance during pregnancy.  Principle diagnosis: fall during pregnancy, preterm contractions  Labor  Not present  Fetal Wellbeing  Reactive NST for baby A and baby B, reassuring for GA  Fall ? No direct trauma to abdomen, no bleeding, regular fetal movement.  Preterm contractions ? Contractions stopped after terbutaline x 2 doses, 1L bolus IVF ? No cervical change over 3 hour period ? Cervical length 3.7cm with 2.4cm of funneling, 1.3cm of closed  cervix distally  D/c home stable, precautions reviewed, follow-up as scheduled.   Follow up in the office as scheduled. Plan to get repeat cervical check at next visit. Rest and pelvic rest advised.   Strongly advised to go to the ED for medical clearance for fall, as patient came directly to L&D triage.   Discussed with Dr.  Ouida Sills, who is in agreement with this plan.   Lisette Grinder 08/09/2018 9:47 PM  ----- Lisette Grinder, CNM Certified Nurse Midwife [facility], Department of [facility]

## 2018-08-09 NOTE — OB Triage Note (Signed)
Pt fell at 1510 in the laundry room, hitting her abdomen and left side fo body. Pt states +FM x2, denies ctxs, LOF or vaginal bleeding.

## 2018-08-09 NOTE — Discharge Instructions (Signed)
Preterm Labor and Birth Information °Pregnancy normally lasts 39-41 weeks. Preterm labor is when labor starts early. It starts before you have been pregnant for 37 whole weeks. °What are the risk factors for preterm labor? °Preterm labor is more likely to occur in women who: °· Have an infection while pregnant. °· Have a cervix that is short. °· Have gone into preterm labor before. °· Have had surgery on their cervix. °· Are younger than age 29. °· Are older than age 35. °· Are African American. °· Are pregnant with two or more babies. °· Take street drugs while pregnant. °· Smoke while pregnant. °· Do not gain enough weight while pregnant. °· Got pregnant right after another pregnancy. °What are the symptoms of preterm labor? °Symptoms of preterm labor include: °· Cramps. The cramps may feel like the cramps some women get during their period. The cramps may happen with watery poop (diarrhea). °· Pain in the belly (abdomen). °· Pain in the lower back. °· Regular contractions or tightening. It may feel like your belly is getting tighter. °· Pressure in the lower belly that seems to get stronger. °· More fluid (discharge) leaking from the vagina. The fluid may be watery or bloody. °· Water breaking. °Why is it important to notice signs of preterm labor? °Babies who are born early may not be fully developed. They have a higher chance for: °· Long-term heart problems. °· Long-term lung problems. °· Trouble controlling body systems, like breathing. °· Bleeding in the brain. °· A condition called cerebral palsy. °· Learning difficulties. °· Death. °These risks are highest for babies who are born before 34 weeks of pregnancy. °How is preterm labor treated? °Treatment depends on: °· How long you were pregnant. °· Your condition. °· The health of your baby. °Treatment may involve: °· Having a stitch (suture) placed in your cervix. When you give birth, your cervix opens so the baby can come out. The stitch keeps the cervix  from opening too soon. °· Staying at the hospital. °· Taking or getting medicines, such as: °? Hormone medicines. °? Medicines to stop contractions. °? Medicines to help the baby’s lungs develop. °? Medicines to prevent your baby from having cerebral palsy. °What should I do if I am in preterm labor? °If you think you are going into labor too soon, call your doctor right away. °How can I prevent preterm labor? °· Do not use any tobacco products. °? Examples of these are cigarettes, chewing tobacco, and e-cigarettes. °? If you need help quitting, ask your doctor. °· Do not use street drugs. °· Do not use any medicines unless you ask your doctor if they are safe for you. °· Talk with your doctor before taking any herbal supplements. °· Make sure you gain enough weight. °· Watch for infection. If you think you might have an infection, get it checked right away. °· If you have gone into preterm labor before, tell your doctor. °This information is not intended to replace advice given to you by your health care provider. Make sure you discuss any questions you have with your health care provider. °Document Released: 09/11/2008 Document Revised: 11/26/2015 Document Reviewed: 11/06/2015 °Elsevier Interactive Patient Education © 2019 [employer] ° °

## 2018-08-09 NOTE — Progress Notes (Signed)
Brianna Steele is a 28 y.o. female. She is at [redacted]w[redacted]d gestation. Patient's last menstrual period was 02/16/2018. Estimated Date of Delivery: 11/23/18  Prenatal care site: [facility]   Chief complaint: fall at home Location: bedroom at room Onset/timing: today at 1510 Duration: once Quality: fell Severity: n/a Aggravating or alleviating conditions: n/a Associated signs/symptoms: none Context: Brianna Steele reports that she was taking a nap this afternoon when she got up abruptly to get something out of the dryer (the noise woke her up). When she got up to go to the laundry room, she tripped and fell. She landed on her right knee and then fell to the left, where she hit the left side of her body and her head on the door frame. She reports no loss of consciousness. She reports some right knee pain and left arm/wrist pain. She has had some abdominal cramping and tightening.   S: Resting comfortably.   She reports:  -active fetal movement -no leakage of fluid -no vaginal bleeding -no contractions, but she has had some abdominal cramping  Maternal Medical History:   Past Medical History:  Diagnosis Date  . Medical history non-contributory     Past Surgical History:  Procedure Laterality Date  . TONSILLECTOMY AND ADENOIDECTOMY  04/1998    No Known Allergies  Prior to Admission medications   Medication Sig Start Date End Date Taking? Authorizing Provider  aspirin EC 81 MG tablet Take 81 mg by mouth daily.   Yes [provider]  Multiple Vitamin (MULTI VITAMIN DAILY PO) Take by mouth. flintstone vit   Yes [provider]  Pyridoxine HCl (B-6 PO) Take by mouth 2 (two) times daily.   Yes [provider]     Social History: She  reports that she has never smoked. She has never used smokeless tobacco. She reports that she does not drink alcohol or use drugs.  Family History: family history includes Cancer in her paternal grandmother; Cancer (age of onset:  70) in her father and mother; Heart disease in her paternal grandfather; Kidney Stones in an other family member.  Review of Systems: A full review of systems was performed and negative except as noted in the HPI.     O:  BP 108/73 (BP Location: Right Arm)   Temp 98.3 F (36.8 C) (Oral)   Resp 18   LMP 02/16/2018   SpO2 100%  No results found for this or any previous visit (from the past 48 hour(s)).   Constitutional: NAD, AAOx3  HE/ENT: extraocular movements grossly intact, moist mucous membranes CV: RRR PULM: normal respiratory effort, CTABL     Abd: gravid, mild diffuse tenderness, non-distended, soft  Ext: Non-tender, Nonedmeatous   Psych: mood appropriate, speech normal Pelvic: 1cm/long/high posterior/medium  NST/monitoring:  Baby A: Baseline: 140bpm Variability: moderate Accelerations: absent Decelerations: absent  Baby B: Baseline: 140bpm Variability: moderate Accelerations: absent Decelerations: absent  Toco: contractions currently q2-3 minutes, currently uterine irritability to occasional contraction   A/P: 28 y.o. [redacted]w[redacted]d here for antenatal surveillance during pregnancy.  Principle diagnosis: fall during pregnancy, preterm contractions  Labor  Not present  Fetal Wellbeing  Reassuring for GA  Fall ? No direct trauma to abdomen, no bleeding, regular fetal movement.   Preterm contractions ? S/p terbutaline x 2 doses, 1L bolus IVF ? Continue to monitor, plan to recheck cervix  Brianna Steele 08/09/2018 6:45 PM  ----- Brianna Steele, CNM Certified Nurse Midwife [facility], Department of [facility]

## 2018-09-05 ENCOUNTER — Observation Stay
Admission: EM | Admit: 2018-09-05 | Discharge: 2018-09-05 | Payer: Managed Care, Other (non HMO) | Attending: Obstetrics & Gynecology | Admitting: Obstetrics & Gynecology

## 2018-09-05 ENCOUNTER — Other Ambulatory Visit: Payer: Self-pay

## 2018-09-05 DIAGNOSIS — O30043 Twin pregnancy, dichorionic/diamniotic, third trimester: Secondary | ICD-10-CM | POA: Insufficient documentation

## 2018-09-05 DIAGNOSIS — Z3A28 28 weeks gestation of pregnancy: Secondary | ICD-10-CM | POA: Insufficient documentation

## 2018-09-05 DIAGNOSIS — O321XX2 Maternal care for breech presentation, fetus 2: Secondary | ICD-10-CM | POA: Diagnosis not present

## 2018-09-05 LAB — CBC
HCT: 30.7 % — ABNORMAL LOW (ref 36.0–46.0)
Hemoglobin: 10.5 g/dL — ABNORMAL LOW (ref 12.0–15.0)
MCH: 32.8 pg (ref 26.0–34.0)
MCHC: 34.2 g/dL (ref 30.0–36.0)
MCV: 95.9 fL (ref 80.0–100.0)
PLATELETS: 131 10*3/uL — AB (ref 150–400)
RBC: 3.2 MIL/uL — ABNORMAL LOW (ref 3.87–5.11)
RDW: 12.9 % (ref 11.5–15.5)
WBC: 11.9 10*3/uL — ABNORMAL HIGH (ref 4.0–10.5)
nRBC: 0 % (ref 0.0–0.2)

## 2018-09-05 LAB — URINALYSIS, ROUTINE W REFLEX MICROSCOPIC
Bilirubin Urine: NEGATIVE
Glucose, UA: NEGATIVE mg/dL
Hgb urine dipstick: NEGATIVE
Ketones, ur: NEGATIVE mg/dL
Leukocytes,Ua: NEGATIVE
Nitrite: NEGATIVE
PH: 8 (ref 5.0–8.0)
Protein, ur: NEGATIVE mg/dL
SPECIFIC GRAVITY, URINE: 1.005 (ref 1.005–1.030)

## 2018-09-05 LAB — CHLAMYDIA/NGC RT PCR (ARMC ONLY)
Chlamydia Tr: NOT DETECTED
N gonorrhoeae: NOT DETECTED

## 2018-09-05 LAB — COMPREHENSIVE METABOLIC PANEL
ALK PHOS: 65 U/L (ref 38–126)
ALT: 14 U/L (ref 0–44)
AST: 18 U/L (ref 15–41)
Albumin: 2.9 g/dL — ABNORMAL LOW (ref 3.5–5.0)
Anion gap: 9 (ref 5–15)
CALCIUM: 8.3 mg/dL — AB (ref 8.9–10.3)
CO2: 21 mmol/L — ABNORMAL LOW (ref 22–32)
Chloride: 108 mmol/L (ref 98–111)
Creatinine, Ser: 0.4 mg/dL — ABNORMAL LOW (ref 0.44–1.00)
GFR calc Af Amer: >60 mL/min (ref >60)
GFR calc non Af Amer: >60 mL/min (ref >60)
Glucose, Bld: 94 mg/dL (ref 70–99)
Potassium: 3 mmol/L — ABNORMAL LOW (ref 3.5–5.1)
Sodium: 138 mmol/L (ref 135–145)
Total Bilirubin: 0.4 mg/dL (ref 0.3–1.2)
Total Protein: 5.8 g/dL — ABNORMAL LOW (ref 6.5–8.1)

## 2018-09-05 LAB — TYPE AND SCREEN
ABO/RH(D): O POS
Antibody Screen: NEGATIVE

## 2018-09-05 LAB — GROUP B STREP BY PCR: Group B strep by PCR: NEGATIVE

## 2018-09-05 LAB — FETAL FIBRONECTIN: Fetal Fibronectin: NEGATIVE

## 2018-09-05 MED ORDER — INDOMETHACIN 25 MG PO CAPS
25.0000 mg | ORAL_CAPSULE | Freq: Four times a day (QID) | ORAL | Status: DC
  Filled 2018-09-05: qty 1

## 2018-09-05 MED ORDER — LACTATED RINGERS IV SOLN
INTRAVENOUS | Status: DC
  Administered 2018-09-05 (×2): via INTRAVENOUS

## 2018-09-05 MED ORDER — INDOMETHACIN 50 MG PO CAPS
50.0000 mg | ORAL_CAPSULE | Freq: Once | ORAL | Status: AC
  Administered 2018-09-05: 50 mg via ORAL
  Filled 2018-09-05: qty 1

## 2018-09-05 MED ORDER — TERBUTALINE SULFATE 1 MG/ML IJ SOLN
0.2500 mg | Freq: Once | INTRAMUSCULAR | Status: AC
  Administered 2018-09-05: 0.25 mg via SUBCUTANEOUS

## 2018-09-05 MED ORDER — BETAMETHASONE SOD PHOS & ACET 6 (3-3) MG/ML IJ SUSP
INTRAMUSCULAR | Status: AC
  Administered 2018-09-05: 12 mg via INTRAMUSCULAR
  Filled 2018-09-05: qty 5

## 2018-09-05 MED ORDER — TERBUTALINE SULFATE 1 MG/ML IJ SOLN
INTRAMUSCULAR | Status: AC
  Administered 2018-09-05: 0.25 mg via SUBCUTANEOUS
  Filled 2018-09-05: qty 1

## 2018-09-05 MED ORDER — MAGNESIUM SULFATE BOLUS VIA INFUSION
6.0000 g | Freq: Once | INTRAVENOUS | Status: AC
  Administered 2018-09-05: 6 g via INTRAVENOUS
  Filled 2018-09-05: qty 500

## 2018-09-05 MED ORDER — MAGNESIUM SULFATE 40 G IN LACTATED RINGERS - SIMPLE
2.0000 g/h | INTRAVENOUS | Status: DC
  Administered 2018-09-05: 1 g/h via INTRAVENOUS
  Filled 2018-09-05: qty 500

## 2018-09-05 MED ORDER — BETAMETHASONE SOD PHOS & ACET 6 (3-3) MG/ML IJ SUSP
12.0000 mg | INTRAMUSCULAR | Status: DC
  Administered 2018-09-05: 12 mg via INTRAMUSCULAR

## 2018-09-05 MED ORDER — LACTATED RINGERS IV BOLUS
500.0000 mL | Freq: Once | INTRAVENOUS | Status: AC
  Administered 2018-09-05: 500 mL via INTRAVENOUS

## 2018-09-05 MED ORDER — TERBUTALINE SULFATE 1 MG/ML IJ SOLN
0.2500 mg | Freq: Once | INTRAMUSCULAR | Status: AC | PRN
  Administered 2018-09-05: 0.25 mg via SUBCUTANEOUS

## 2018-09-05 NOTE — OB Triage Note (Signed)
Patient reports to L&D with complaints of contractions that started around 9:30 this morning with some shortness of breath. Denies leaking of fluid or vaginal bleeding, reports normal fetal movements. Patient states shortness of breath has gotten better since she's been here.

## 2018-09-05 NOTE — Discharge Summary (Signed)
Patient ID: Brianna Steele MRN: [id number] DOB/AGE: [birth year] 28 y.o.  Admit date: 09/05/2018 Discharge date: 09/05/2018  Admission Diagnoses: preterm labor  Discharge Diagnoses: preterm labor  Prenatal Procedures: ultrasound (x2)  Consults: MFM @ [facility] for transfer  Significant Diagnostic Studies:  Results for orders placed or performed during the hospital encounter of 09/05/18 (from the past 168 hour(s))  Urinalysis, Routine w reflex microscopic   Collection Time: 09/05/18 12:56 PM  Result Value Ref Range   Color, Urine STRAW (A) YELLOW   APPearance CLEAR (A) CLEAR   Specific Gravity, Urine 1.005 1.005 - 1.030   pH 8.0 5.0 - 8.0   Glucose, UA NEGATIVE NEGATIVE mg/dL   Hgb urine dipstick NEGATIVE NEGATIVE   Bilirubin Urine NEGATIVE NEGATIVE   Ketones, ur NEGATIVE NEGATIVE mg/dL   Protein, ur NEGATIVE NEGATIVE mg/dL   Nitrite NEGATIVE NEGATIVE   Leukocytes,Ua NEGATIVE NEGATIVE  Fetal fibronectin   Collection Time: 09/05/18  1:28 PM  Result Value Ref Range   Fetal Fibronectin NEGATIVE NEGATIVE   Appearance, FETFIB CLEAR CLEAR  Comprehensive metabolic panel   Collection Time: 09/05/18  3:45 PM  Result Value Ref Range   Sodium 138 135 - 145 mmol/L   Potassium 3.0 (L) 3.5 - 5.1 mmol/L   Chloride 108 98 - 111 mmol/L   CO2 21 (L) 22 - 32 mmol/L   Glucose, Bld 94 70 - 99 mg/dL   BUN <5 (L) 6 - 20 mg/dL   Creatinine, Ser 0.40 (L) 0.44 - 1.00 mg/dL   Calcium 8.3 (L) 8.9 - 10.3 mg/dL   Total Protein 5.8 (L) 6.5 - 8.1 g/dL   Albumin 2.9 (L) 3.5 - 5.0 g/dL   AST 18 15 - 41 U/L   ALT 14 0 - 44 U/L   Alkaline Phosphatase 65 38 - 126 U/L   Total Bilirubin 0.4 0.3 - 1.2 mg/dL   GFR calc non Af Amer >60 >60 mL/min   GFR calc Af Amer >60 >60 mL/min   Anion gap 9 5 - 15  CBC on admission   Collection Time: 09/05/18  3:45 PM  Result Value Ref Range   WBC 11.9 (H) 4.0 - 10.5 K/uL   RBC 3.20 (L) 3.87 - 5.11 MIL/uL   Hemoglobin 10.5 (L) 12.0 - 15.0 g/dL   HCT 30.7 (L) 36.0 - 46.0 %   MCV 95.9 80.0 - 100.0 fL   MCH 32.8 26.0 - 34.0 pg   MCHC 34.2 30.0 - 36.0 g/dL   RDW 12.9 11.5 - 15.5 %   Platelets 131 (L) 150 - 400 K/uL   nRBC 0.0 0.0 - 0.2 %  Type and screen [place]   Collection Time: 09/05/18  3:45 PM  Result Value Ref Range   ABO/RH(D) O POS    Antibody Screen NEG    Sample Expiration      09/08/2018 Performed at [facility], [address] [place], [place] [address]     Treatments: IV hydration, terbutaline x1, indomethacin 50mg  load, and betamethasone x1 at 15:56  Hospital Course:  This is a 28 y.o. G1P0 with di/di twin IUP at [redacted]w[redacted]d admitted for preterm labor, noted to have a cervical exam of 3.5/80/ballottable with fetal head A low in the pelvis.  No leaking of fluid and no bleeding.  She was initially started on magnesium sulfate for tocolysis and neuroprotection and also received betamethasone x 1 dose.  Her tocolysis was transitioned to indomethacin. She was observed, fetal heart rate monitoring remained  reassuring, and due to her gestational age, arrangement was made for transfer to a level 3 NICU. Her cervical exam was unchanged from admission.   Discharge Physical Exam:  BP 115/71   Pulse (!) 109   Temp 98.3 F (36.8 C) (Oral)   Resp 19   LMP 02/16/2018   General: NAD CV: RRR Pulm: CTABL, nl effort ABD: s/nd/nt, gravid DVT Evaluation: LE non-ttp, no evidence of DVT on exam.  Discharge Condition: Stable  Disposition: Discharge disposition: [facility] Care Institution Not Defined          Signed: ----- Larey Days, MD Attending Obstetrician and Gynecologist [facility]

## 2018-09-05 NOTE — H&P (Addendum)
OB History & Physical   History of Present Illness:  Chief Complaint:   HPI:  Jolonda Gomm is a 28 y.o. G1P0 female at [redacted]w[redacted]d with di/di twins, dated by Patient's last menstrual period was 02/16/2018. consistent with 9+wk ultrasound. Estimated Date of Delivery: 11/23/18  She presents to L&D for persistent contractions since 09:30 today, accompanied by shortness of breath that has resolved.   +FM x2, +CTX, no LOF, no VB  Pregnancy Issues: 1. Di/di twins, spontaneous conception 2. Resolved low-lying placenta 3. Preterm labor 4. Pt does not swallow pills if possible.   Ultrasound 09/01/2018:  Baby A: EFW-1136 g @42 %, post plac, placenta 6.32 cm from os, vertex mat rt Baby B: EFW-1119 g @40 %, post plac, breech mat lt  Maternal Medical History:   Past Medical History:  Diagnosis Date  . Medical history non-contributory     Past Surgical History:  Procedure Laterality Date  . TONSILLECTOMY AND ADENOIDECTOMY  04/1998    No Known Allergies  Prior to Admission medications   Medication Sig Start Date End Date Taking? Authorizing Provider  acetaminophen (TYLENOL) 325 MG tablet Take 2 tablets (650 mg total) by mouth every 4 (four) hours as needed for mild pain or moderate pain. 08/09/18   Lisette Grinder, CNM  aspirin EC 81 MG tablet Take 81 mg by mouth daily.    [provider]  Multiple Vitamin (MULTI VITAMIN DAILY PO) Take by mouth. [place] vit    [provider]  Pyridoxine HCl (B-6 PO) Take by mouth 2 (two) times daily.    [provider]     Prenatal care site: [facility]    Social History: She  reports that she has never smoked. She has never used smokeless tobacco. She reports that she does not drink alcohol or use drugs.  Family History: family history includes Cancer in her paternal grandmother; Cancer (age of onset: 15) in her father and mother; Heart disease in her paternal grandfather; Kidney Stones in an other family member.    Review of Systems: A full review of systems was performed and negative except as noted in the HPI.     Physical Exam:  Vital Signs: BP 115/71   Pulse (!) 109   Temp 98.3 F (36.8 C) (Oral)   Resp 19   LMP 02/16/2018  General: no acute distress.  HEENT: normocephalic, atraumatic Heart: regular rate & rhythm.  No murmurs/rubs/gallops Lungs: clear to auscultation bilaterally, normal respiratory effort Abdomen: soft, gravid, non-tender;  EFW: Baby A 1150g, Baby B 1150g Pelvic:   External: Normal external female genitalia  Cervix: Dilation: 3 / Effacement (%): 80 / Station: Ballotable    Extremities: non-tender, symmetric, no edema bilaterally.  DTRs: 2+ Neurologic: Alert & oriented x 3.    Results for orders placed or performed during the hospital encounter of 09/05/18 (from the past 24 hour(s))  Urinalysis, Routine w reflex microscopic     Status: Abnormal   Collection Time: 09/05/18 12:56 PM  Result Value Ref Range   Color, Urine STRAW (A) YELLOW   APPearance CLEAR (A) CLEAR   Specific Gravity, Urine 1.005 1.005 - 1.030   pH 8.0 5.0 - 8.0   Glucose, UA NEGATIVE NEGATIVE mg/dL   Hgb urine dipstick NEGATIVE NEGATIVE   Bilirubin Urine NEGATIVE NEGATIVE   Ketones, ur NEGATIVE NEGATIVE mg/dL   Protein, ur NEGATIVE NEGATIVE mg/dL   Nitrite NEGATIVE NEGATIVE   Leukocytes,Ua NEGATIVE NEGATIVE  Fetal fibronectin     Status: None  Collection Time: 09/05/18  1:28 PM  Result Value Ref Range   Fetal Fibronectin NEGATIVE NEGATIVE   Appearance, FETFIB CLEAR CLEAR  Comprehensive metabolic panel     Status: Abnormal   Collection Time: 09/05/18  3:45 PM  Result Value Ref Range   Sodium 138 135 - 145 mmol/L   Potassium 3.0 (L) 3.5 - 5.1 mmol/L   Chloride 108 98 - 111 mmol/L   CO2 21 (L) 22 - 32 mmol/L   Glucose, Bld 94 70 - 99 mg/dL   BUN <5 (L) 6 - 20 mg/dL   Creatinine, Ser 0.40 (L) 0.44 - 1.00 mg/dL   Calcium 8.3 (L) 8.9 - 10.3 mg/dL   Total Protein 5.8 (L) 6.5 - 8.1 g/dL    Albumin 2.9 (L) 3.5 - 5.0 g/dL   AST 18 15 - 41 U/L   ALT 14 0 - 44 U/L   Alkaline Phosphatase 65 38 - 126 U/L   Total Bilirubin 0.4 0.3 - 1.2 mg/dL   GFR calc non Af Amer >60 >60 mL/min   GFR calc Af Amer >60 >60 mL/min   Anion gap 9 5 - 15  CBC on admission     Status: Abnormal   Collection Time: 09/05/18  3:45 PM  Result Value Ref Range   WBC 11.9 (H) 4.0 - 10.5 K/uL   RBC 3.20 (L) 3.87 - 5.11 MIL/uL   Hemoglobin 10.5 (L) 12.0 - 15.0 g/dL   HCT 30.7 (L) 36.0 - 46.0 %   MCV 95.9 80.0 - 100.0 fL   MCH 32.8 26.0 - 34.0 pg   MCHC 34.2 30.0 - 36.0 g/dL   RDW 12.9 11.5 - 15.5 %   Platelets 131 (L) 150 - 400 K/uL   nRBC 0.0 0.0 - 0.2 %  Type and screen [place]     Status: None   Collection Time: 09/05/18  3:45 PM  Result Value Ref Range   ABO/RH(D) O POS    Antibody Screen NEG    Sample Expiration      09/08/2018 Performed at [facility], [address] [place],  [address]     Pertinent Results:  Prenatal Labs: Blood type/Rh O positive  Antibody screen neg  Rubella Immune  Varicella Immune  RPR NR  HBsAg Neg  HIV NR  GC neg  Chlamydia neg  Genetic screening Declined testing  1 hour GTT Not done  3 hour GTT   GBS pending   FHT: A: 145 mod + accels no decels, B: 140 mod + accels no decels TOCO: q 65min SVE:  Dilation: 3 / Effacement (%): 80 / Station: Ballotable on exam x2 by me. Fetal head moveable but low in pelvis.   Bedside ultrasound:  A: cephalic and low, maternal left B: breech, maternal right Both are very active.  AMPLE FLUID x2  Assessment:  Samanth Mirkin is a 28 y.o. G1P0 female at [redacted]w[redacted]d with di di twins in preterm labor.   Plan:  1. Admit to Labor & Delivery, arrange for transfer to [facility] once stability is determined - NICU has beds open and staff accepting (J. Federspiel) 2. Betamethasone, Mag sulfate for neuroprotection, terbutaline x1 then indomethacin q6h 3. IV fluid bolus 4. GBS and GC/CT collected, UA,  Urine culture, CBC, CMP, Type and screen 5. NPO except for meds.   ----- Larey Days, MD Attending Obstetrician and Gynecologist [facility], Department of [facility]

## 2018-09-06 LAB — URINE CULTURE: CULTURE: NO GROWTH

## 2018-09-13 MED ORDER — POLYETHYLENE GLYCOL 3350 17 G PO PACK
17.00 | PACK | ORAL | Status: DC
Start: ? — End: 2018-09-13

## 2018-09-13 MED ORDER — FOLIC ACID 1 MG PO TABS
2.00 | ORAL_TABLET | ORAL | Status: DC
Start: 2018-09-14 — End: 2018-09-13

## 2018-09-13 MED ORDER — GENERIC EXTERNAL MEDICATION
1.00 | Status: DC
Start: 2018-09-14 — End: 2018-09-13

## 2018-09-13 MED ORDER — ONDANSETRON HCL 4 MG/2ML IJ SOLN
4.00 | INTRAMUSCULAR | Status: DC
Start: ? — End: 2018-09-13

## 2018-09-13 MED ORDER — ACETAMINOPHEN 325 MG PO TABS
975.00 | ORAL_TABLET | ORAL | Status: DC
Start: ? — End: 2018-09-13

## 2018-09-13 MED ORDER — DOCUSATE SODIUM 100 MG PO CAPS
100.00 | ORAL_CAPSULE | ORAL | Status: DC
Start: ? — End: 2018-09-13

## 2018-10-07 ENCOUNTER — Other Ambulatory Visit: Payer: Self-pay | Admitting: Obstetrics & Gynecology

## 2018-10-07 DIAGNOSIS — O43029 Fetus-to-fetus placental transfusion syndrome, unspecified trimester: Secondary | ICD-10-CM

## 2018-10-13 ENCOUNTER — Other Ambulatory Visit: Payer: Self-pay | Admitting: Obstetrics & Gynecology

## 2018-10-13 DIAGNOSIS — O43029 Fetus-to-fetus placental transfusion syndrome, unspecified trimester: Secondary | ICD-10-CM

## 2018-10-20 ENCOUNTER — Ambulatory Visit: Admission: RE | Admit: 2018-10-20 | Payer: Managed Care, Other (non HMO) | Source: Ambulatory Visit

## 2019-02-15 ENCOUNTER — Encounter (HOSPITAL_COMMUNITY): Payer: Self-pay

## 2019-05-30 ENCOUNTER — Ambulatory Visit (INDEPENDENT_AMBULATORY_CARE_PROVIDER_SITE_OTHER): Payer: Managed Care, Other (non HMO) | Admitting: Family Medicine

## 2019-05-30 ENCOUNTER — Other Ambulatory Visit: Payer: Self-pay

## 2019-05-30 ENCOUNTER — Encounter: Payer: Self-pay | Admitting: Family Medicine

## 2019-05-30 VITALS — BP 122/78 | Temp 98.1°F | Wt 121.0 lb

## 2019-05-30 DIAGNOSIS — R229 Localized swelling, mass and lump, unspecified: Secondary | ICD-10-CM | POA: Diagnosis not present

## 2019-05-30 NOTE — Progress Notes (Signed)
   Subjective:    Patient ID: Brianna Steele, female    DOB: Feb 07, 1991, 28 y.o.   MRN: [id number]  HPI Pt noticed an area in right breast. Pt has picked at the area to see if it was something under the skin. No pain, no tenderness, no swelling. Pt has family history of breast cancer  Patient has family history of breast cancer The patient related that she noted a spot on the breast that was concerning It is on the surface layer of the breast She has never had anything quite like this before She is concerned about the possibility of cancer but states she has not panicked about it PMH benign recently had twins  Family history breast cancer with her mother Review of Systems  Constitutional: Negative for activity change, fatigue and fever.  HENT: Negative for congestion and rhinorrhea.   Respiratory: Negative for cough, chest tightness and shortness of breath.   Cardiovascular: Negative for chest pain and leg swelling.  Gastrointestinal: Negative for abdominal pain and nausea.  Skin: Negative for color change.  Neurological: Negative for dizziness and headaches.  Psychiatric/Behavioral: Negative for agitation and behavioral problems.       Objective:   Physical Exam  Bilateral breast exam left side normal no masses felt right side normal no masses felt There is a small excoriated nodule on the surface of the skin it is felt to be in the dermis layer. This concerns the patient for the possibility of cancer I think the likelihood of it being cancer is low      Assessment & Plan:  Referral to dermatology I think it is reasonable for elliptical excision versus punch biopsy to make sure that this is not a breast cancer I do not feel the patient needs to have mammogram or ultrasound currently.  Patient has an appointment December 17 with Dr. Rozann Lesches

## 2019-05-30 NOTE — Progress Notes (Signed)
Information gave to [facility] to update referral info.

## 2019-06-05 ENCOUNTER — Encounter: Payer: Self-pay | Admitting: Family Medicine

## 2019-07-07 ENCOUNTER — Encounter: Payer: Self-pay | Admitting: Family Medicine

## 2019-07-07 DIAGNOSIS — D235 Other benign neoplasm of skin of trunk: Secondary | ICD-10-CM

## 2019-07-07 HISTORY — DX: Other benign neoplasm of skin of trunk: D23.5

## 2019-11-24 IMAGING — US US OB LIMITED
1 series · 13 of 28 positions shown · non-contrast
Comparison: none

CLINICAL DATA: Twin pregnancy with pre term uterine contractions.

EXAM:
LIMITED OBSTETRIC ULTRASOUND AND TRANSVAGINAL OBSTETRIC ULTRASOUND

[Series 1: us ob limited · 13 of 37 slices shown]
[im 2/37]
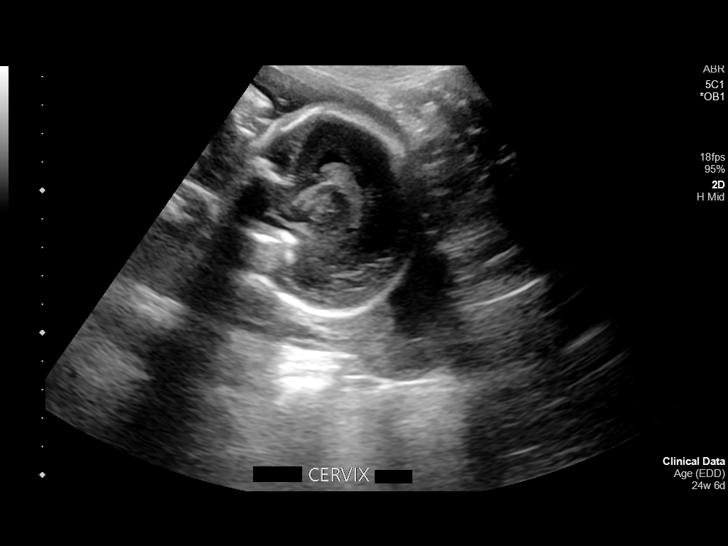
[im 5/37]
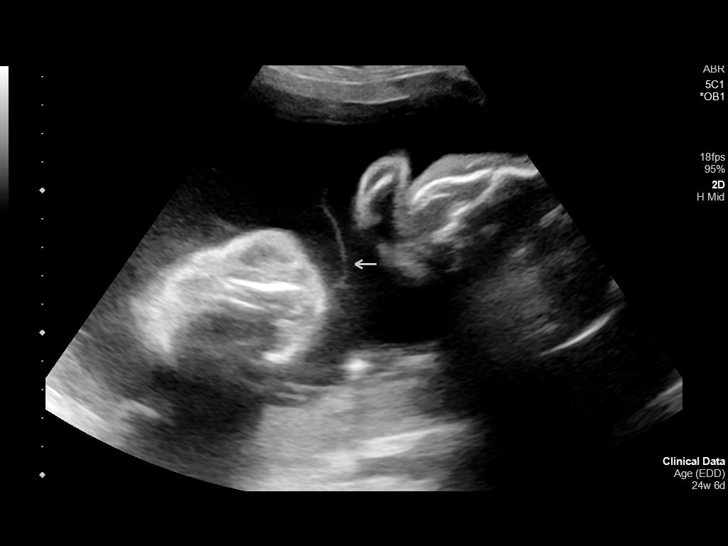
[im 7/37]
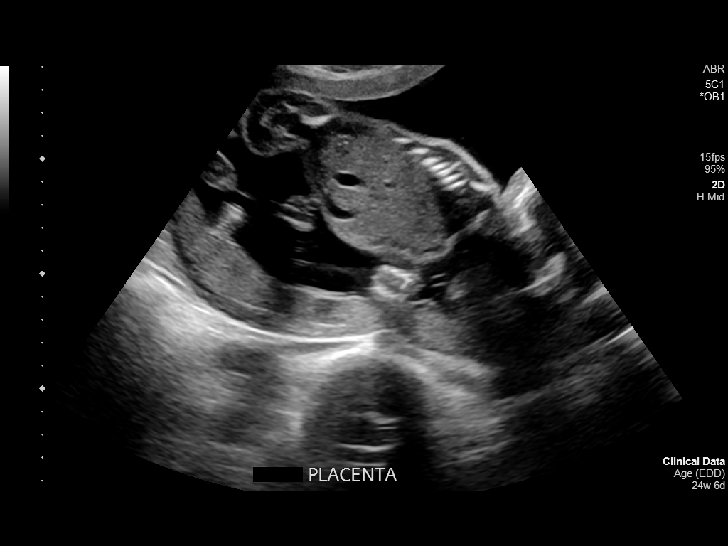
[im 10/37]
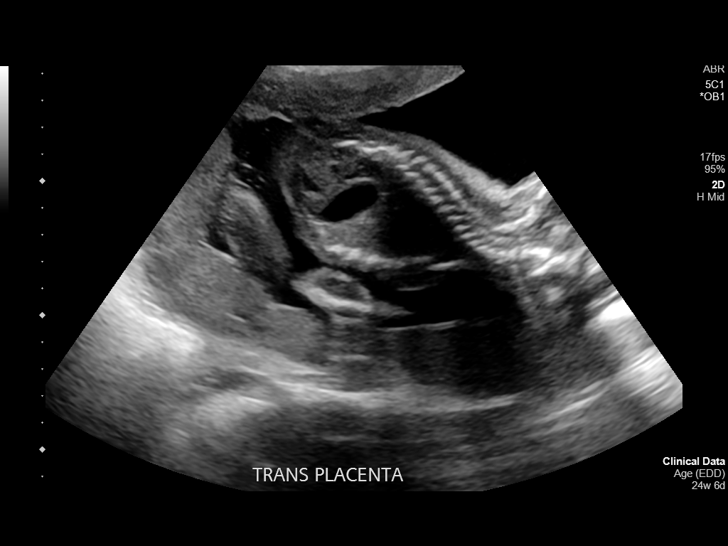
[im 13/37]
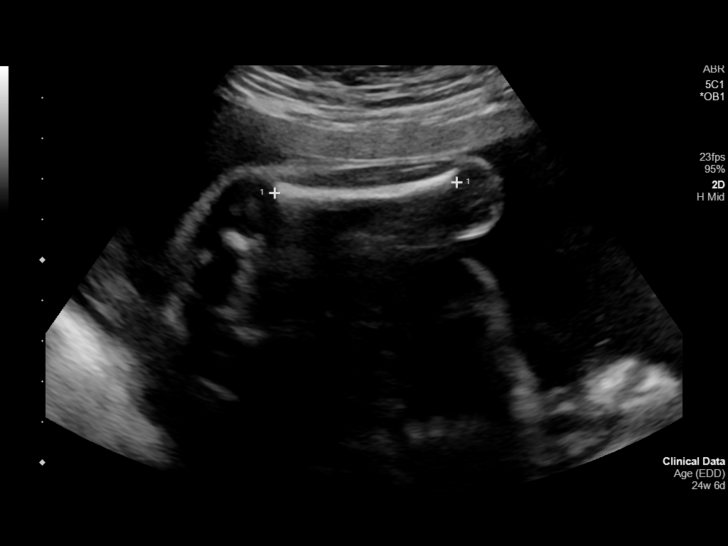
[im 15/37]
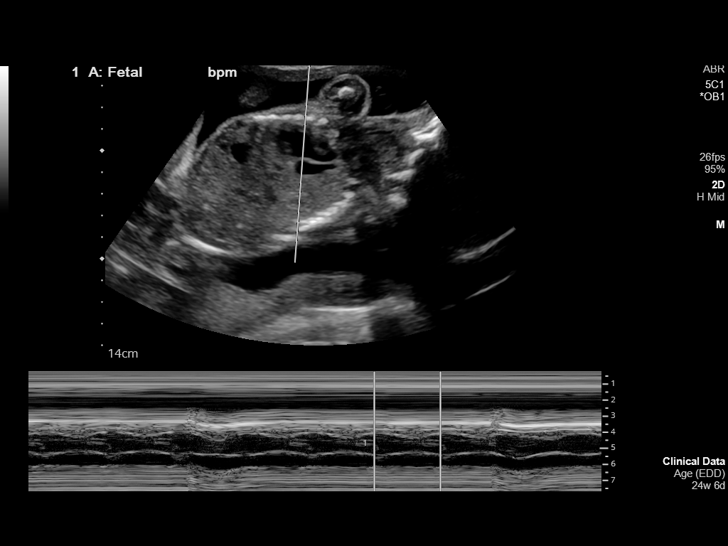
[im 19/37]
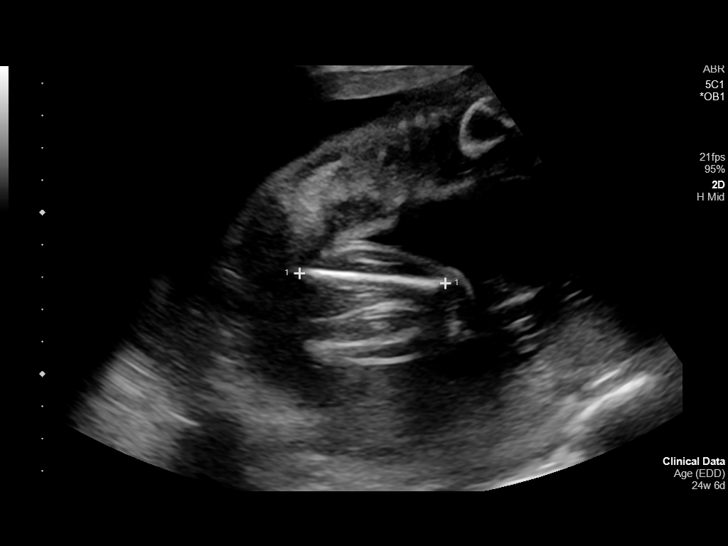
[im 22/37]
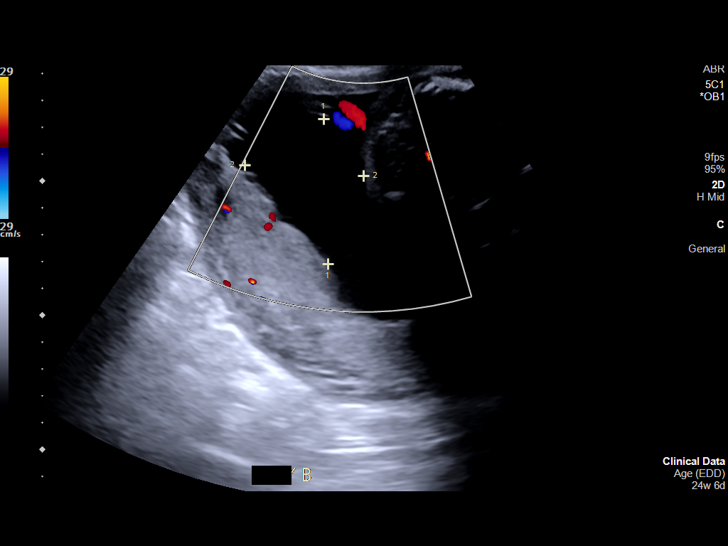
[im 25/37]
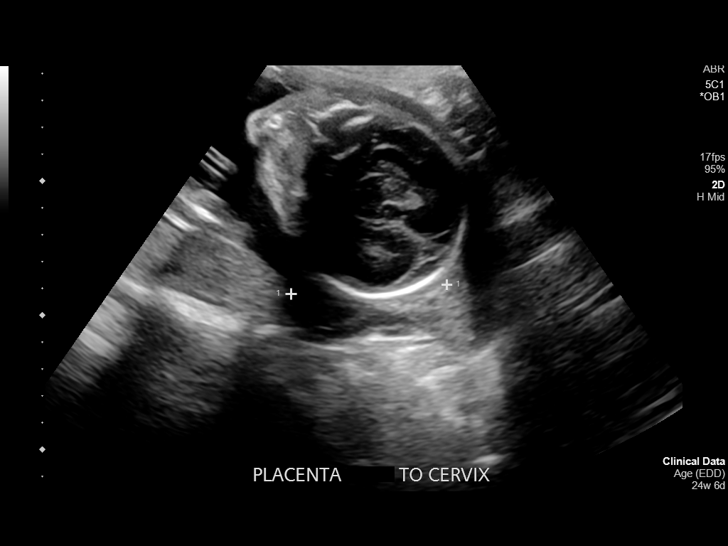
[im 27/37]
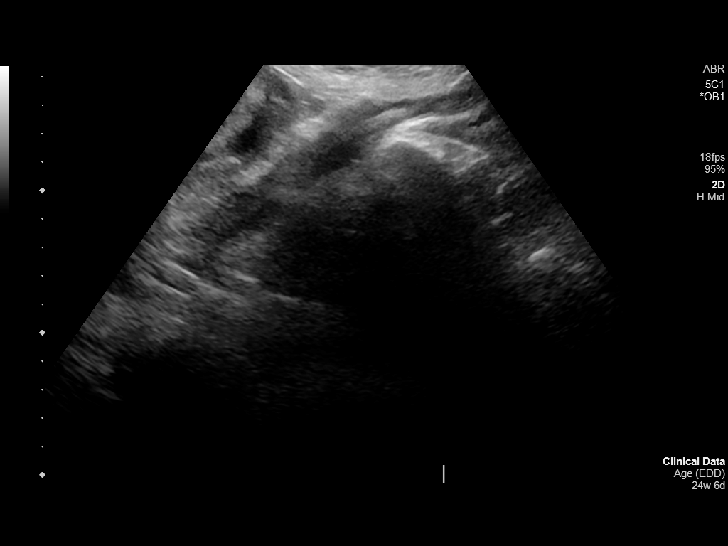
[im 30/37]
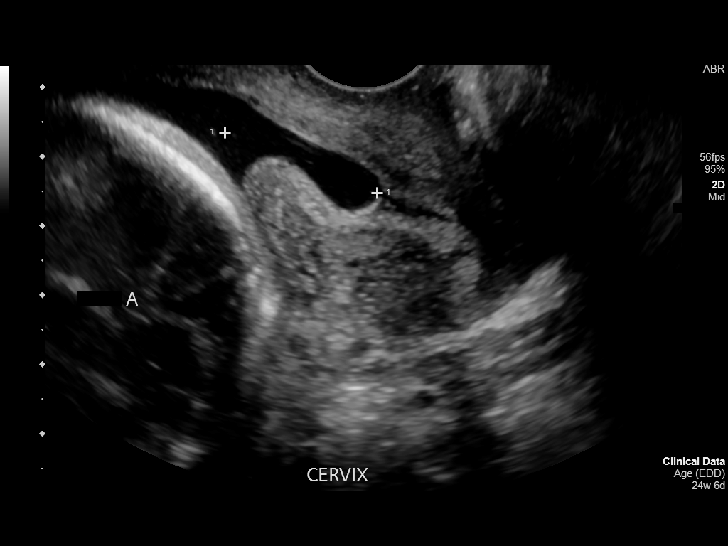
[im 33/37]
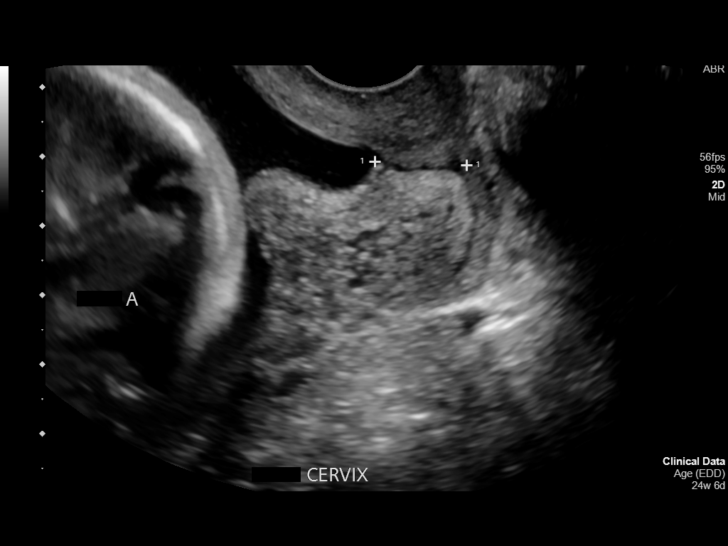
[im 35/37]
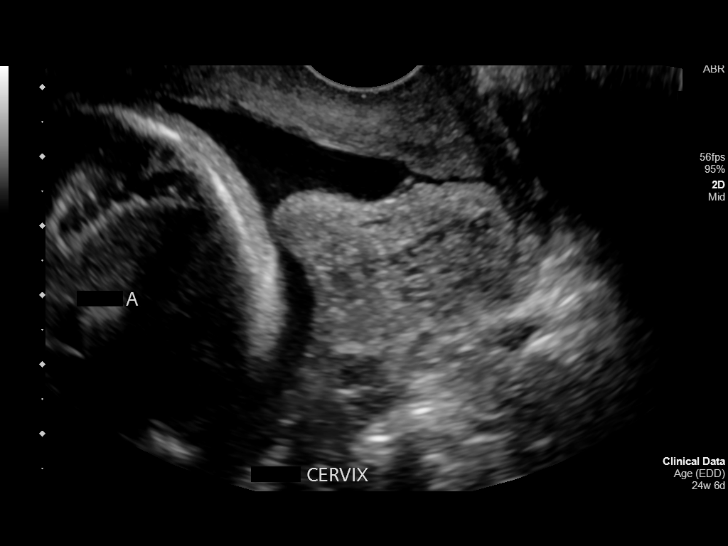

[13 of 28 positions shown; findings below may reference images not displayed]

FINDINGS: Number of Fetuses:  2

Separating Membrane: Visualized

TWIN 1

Heart Rate:  155 bpm

Movement: Yes

Presentation: Variable

Placental Location: Posterior

Previa: No

Amniotic Fluid (Subjective):  Within normal limits.

Distance of placenta to cervical loss: 5.8 cm.

Single deepest vertical pocket of amniotic fluid: 5.5 cm

FL: 4.5cm 24w 6d

TWIN 2

Heart Rate:  149 bpm

Movement: Yes

Presentation: Variable

Placental Location: Posterior

Previa: None

Amniotic Fluid (Subjective): Within normal limits.

Single deepest vertical pocket of amniotic fluid: 5.4 cm

FL: 4.53cm 25w 0d

MATERNAL FINDINGS:

Cervix: 2.4 cm of funneling of the proximal cervix is identified
with the closed portion of the distal cervix only measuring 1.3 cm.

Uterus/Adnexae: No abnormality visualized.
IMPRESSION: Funneling of the cervix with the closed portion of the cervix only
measuring 1.3 cm in length.

Twin intrauterine viable pregnancies as above. Posterior placenta
without previa.

This exam is performed on an emergent basis and does not
comprehensively evaluate fetal size, dating, or anatomy; follow-up
complete OB US should be considered if further fetal assessment is
warranted.

## 2020-05-02 ENCOUNTER — Encounter: Payer: Self-pay | Admitting: Emergency Medicine

## 2020-05-02 ENCOUNTER — Other Ambulatory Visit: Payer: Self-pay

## 2020-05-02 ENCOUNTER — Ambulatory Visit
Admission: EM | Admit: 2020-05-02 | Discharge: 2020-05-02 | Disposition: A | Discharge status: Home / Self Care | Payer: Managed Care, Other (non HMO) | Attending: Emergency Medicine | Admitting: Emergency Medicine

## 2020-05-02 DIAGNOSIS — R197 Diarrhea, unspecified: Secondary | ICD-10-CM | POA: Diagnosis not present

## 2020-05-02 DIAGNOSIS — R112 Nausea with vomiting, unspecified: Secondary | ICD-10-CM | POA: Diagnosis not present

## 2020-05-02 HISTORY — DX: Depression, unspecified: F32.A

## 2020-05-02 MED ORDER — ONDANSETRON HCL 4 MG PO TABS: 4.0000 mg | ORAL_TABLET | Freq: Four times a day (QID) | ORAL | 0 refills | Status: DC

## 2020-05-02 NOTE — Discharge Instructions (Signed)
COVID testing ordered.  It will take between 5-7 days for test results.  Someone will contact you regarding abnormal results.    In the meantime: You should remain isolated in your home for 10 days from symptom onset AND greater than 72 hours after symptoms resolution (absence of fever without the use of fever-reducing medication and improvement in respiratory symptoms), whichever is longer Get plenty of rest and push fluids Zofran prescribed for nausea and vomiting  Use OTC zyrtec for nasal congestion, runny nose, and/or sore throat Use OTC flonase for nasal congestion and runny nose Use medications daily for symptom relief Use OTC medications like ibuprofen or tylenol as needed fever or pain Call or go to the ED if you have any new or worsening symptoms such as fever, cough, shortness of breath, chest tightness, chest pain, turning blue, changes in mental status, etc..Marland Kitchen

## 2020-05-02 NOTE — ED Provider Notes (Signed)
[facility]   [id number] 05/02/20 Arrival Time: 5993   CC: COVID symptoms  SUBJECTIVE: History from: patient.  Brianna Steele is a 29 y.o. female who presents with abrupt onset of nausea, vomiting (x 1-2 episodes), and diarrhea (greater than 10 episodes of watery diarrhea) x 1 day.  Denies sick exposure to COVID, flu or strep.  Denies changes in diet or eating anything unusual. Does admit to taking zoloft and vitamins.  Denies alleviating factors.  Worse with stress or when children laying on her belly.  Denies previous symptoms in the past.   Denies fever, chills, fatigue, sinus pain, rhinorrhea, sore throat, SOB, wheezing, chest pain, changes in bladder habits.    ROS: As per HPI.  All other pertinent ROS negative.     Past Medical History:  Diagnosis Date  . Depression   . 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 Rozann Lesches shave biopsy shows dermatofibroma December 2020  . Medical history non-contributory    Past Surgical History:  Procedure Laterality Date  . TONSILLECTOMY AND ADENOIDECTOMY  04/1998   No Known Allergies No current facility-administered medications on file prior to encounter.   No current outpatient medications on file prior to encounter.   Social History   Socioeconomic History  . Marital status: Married    Spouse name: Not on file  . Number of children: Not on file  . Years of education: Not on file  . Highest education level: Not on file  Occupational History  . Not on file  Tobacco Use  . Smoking status: Never Smoker  . Smokeless tobacco: Never Used  Vaping Use  . Vaping Use: Never used  Substance and Sexual Activity  . Alcohol use: No  . Drug use: No  . Sexual activity: Yes    Birth control/protection: Pill  Other Topics Concern  . Not on file  Social History Narrative  . Not on file   Social Determinants of Health   Financial Resource Strain:   . Difficulty of  Paying Living Expenses: Not on file  Food Insecurity:   . Worried About Charity fundraiser in the Last Year: Not on file  . Ran Out of Food in the Last Year: Not on file  Transportation Needs:   . Lack of Transportation (Medical): Not on file  . Lack of Transportation (Non-Medical): Not on file  Physical Activity:   . Days of Exercise per Week: Not on file  . Minutes of Exercise per Session: Not on file  Stress:   . Feeling of Stress : Not on file  Social Connections:   . Frequency of Communication with Friends and Family: Not on file  . Frequency of Social Gatherings with Friends and Family: Not on file  . Attends Religious Services: Not on file  . Active Member of Clubs or Organizations: Not on file  . Attends Archivist Meetings: Not on file  . Marital Status: Not on file  Intimate Partner Violence:   . Fear of Current or Ex-Partner: Not on file  . Emotionally Abused: Not on file  . Physically Abused: Not on file  . Sexually Abused: Not on file   Family History  Problem Relation Age of Onset  . Cancer Mother 58       breast cancer  . Cancer Father 59       testicular  . Cancer Paternal Grandmother  breast  . Heart disease Paternal Grandfather   . Kidney Stones Other     OBJECTIVE:  Vitals:   05/02/20 0818 05/02/20 0819  BP:  (!) 103/59  Pulse: 88   Resp: 17   Temp: 99.1 F (37.3 C)   TempSrc: Oral   SpO2: 98%      General appearance: alert; well-appearing, nontoxic; speaking in full sentences and tolerating own secretions HEENT: NCAT; Ears: EACs clear, TMs pearly gray; Eyes: PERRL.  EOM grossly intact.Nose: nares patent without rhinorrhea, Throat: oropharynx clear, tonsils non erythematous or enlarged, uvula midline  Neck: supple without LAD Lungs: unlabored respirations, symmetrical air entry; cough: absent; no respiratory distress; CTAB Heart: regular rate and rhythm.  Abdomen: soft, nondistended, normal active bowel sounds; nontender to  palpation; no guarding  Skin: warm and dry Psychological: alert and cooperative; normal mood and affect   ASSESSMENT & PLAN:  1. Nausea vomiting and diarrhea     Meds ordered this encounter  Medications  . ondansetron (ZOFRAN) 4 MG tablet    Sig: Take 1 tablet (4 mg total) by mouth every 6 (six) hours.    Dispense:  12 tablet    Refill:  0    Order Specific Question:   Supervising Provider    Answer:   Raylene Everts [[id number]]   COVID testing ordered.  It will take between 5-7 days for test results.  Someone will contact you regarding abnormal results.    In the meantime: You should remain isolated in your home for 10 days from symptom onset AND greater than 72 hours after symptoms resolution (absence of fever without the use of fever-reducing medication and improvement in respiratory symptoms), whichever is longer Get plenty of rest and push fluids Zofran prescribed for nausea and vomiting  Use OTC zyrtec for nasal congestion, runny nose, and/or sore throat Use OTC flonase for nasal congestion and runny nose Use medications daily for symptom relief Use OTC medications like ibuprofen or tylenol as needed fever or pain Call or go to the ED if you have any new or worsening symptoms such as fever, cough, shortness of breath, chest tightness, chest pain, turning blue, changes in mental status, etc...   Will follow up with Dr. Wolfgang Phoenix if not improving over the course of the day to discuss SE of zoloft and vitamins she is currently taking.    Reviewed expectations re: course of current medical issues. Questions answered. Outlined signs and symptoms indicating need for more acute intervention. Patient verbalized understanding. After Visit Summary given.         Lestine Box, PA-C 05/02/20 [phone]

## 2020-05-02 NOTE — ED Triage Notes (Addendum)
Diarrhea started yesterday, vomited x 1 yesterday.

## 2020-05-03 ENCOUNTER — Encounter: Payer: Self-pay | Admitting: Family Medicine

## 2020-05-03 ENCOUNTER — Telehealth (INDEPENDENT_AMBULATORY_CARE_PROVIDER_SITE_OTHER): Payer: Managed Care, Other (non HMO) | Admitting: Family Medicine

## 2020-05-03 DIAGNOSIS — R197 Diarrhea, unspecified: Secondary | ICD-10-CM | POA: Diagnosis not present

## 2020-05-03 LAB — NOVEL CORONAVIRUS, NAA: SARS-CoV-2, NAA: NOT DETECTED

## 2020-05-03 LAB — SARS-COV-2, NAA 2 DAY TAT

## 2020-05-03 NOTE — Progress Notes (Addendum)
Patient ID: Brianna Steele, female    DOB: 28-Mar-1991, 29 y.o.   MRN: [id number]   Chief Complaint  Patient presents with  . Diarrhea   Subjective:  CC: diarrhea since Wednesday  Presents today via telephone visit with a complaint of diarrhea since Wednesday morning.  She also threw up one time on Wednesday, none since.  She is having diarrhea at least every 2 hours.  She has been getting up at night with diarrhea.  2 weeks ago she started taking vitamins, and wonders if this could be related.  Pertinent negatives include no fever, no chills, no chest pain, no shortness of breath, no blood in stools.  She reports she is able to keep fluids down, eating a mild diet and drinking Gatorade.  She was seen at the urgent care yesterday, she was given Zofran, which I do not think she has used.   diarrhea for 2 days. Having about one epidsode every 2 hours. Vomited one time 2 days ago. covid test done yesterday pt states came back yesterday. Seen at urgent care yesterday. Pt thinks diarrhea is coming  from elderberry with vit c and zinc. Apple cider vinegar supplements and  multi vit that she started about 2 weeks. Ago.   Virtual Visit via Telephone Note  I connected with Brianna Steele on 05/03/20 at  3:50 PM EDT by telephone and verified that I am speaking with the correct person using two identifiers.  Location: Patient: home Provider: office   I discussed the limitations, risks, security and privacy concerns of performing an evaluation and management service by telephone and the availability of in person appointments. I also discussed with the patient that there may be a patient responsible charge related to this service. The patient expressed understanding and agreed to proceed.   History of Present Illness:    Observations/Objective:   Assessment and Plan:   Follow Up Instructions:    I discussed the assessment and treatment plan with the patient. The patient was provided an opportunity  to ask questions and all were answered. The patient agreed with the plan and demonstrated an understanding of the instructions.   The patient was advised to call back or seek an in-person evaluation if the symptoms worsen or if the condition fails to improve as anticipated.  I provided 11 minutes of non-face-to-face time during this encounter.      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   Outpatient Encounter Medications as of 05/03/2020  Medication Sig  . ondansetron (ZOFRAN) 4 MG tablet Take 1 tablet (4 mg total) by mouth every 6 (six) hours. (Patient not taking: Reported on 05/03/2020)   No facility-administered encounter medications on file as of 05/03/2020.     Review of Systems  Constitutional: Negative for chills and fever.  Respiratory: Negative for shortness of breath.   Cardiovascular: Negative for chest pain.  Gastrointestinal: Positive for diarrhea, nausea and vomiting. Negative for abdominal pain and blood in stool.       Vomited x 1 one on Wednesday. Diarrhea every 2 hours.     Vitals There were no vitals taken for this visit. Telephone visit Objective:   Physical Exam  Telephone visit. Assessment and Plan   1. Diarrhea, unspecified type   Likely from a viral gastrointestinal infection.  She is able to keep fluids down, eating a mild diet, drinking Gatorade.  Explained that most viruses will just have to  run their course.  She wonders if it is from the vitamins that she started 2 weeks ago, she has stopped those vitamins, I told her the way to know for sure is once this passes that if she starts the vitamins and the diarrhea restarts and then she will know for sure.    She was seen at the urgent care yesterday, was given Zofran, has not started that.  Encouraged her to take OTC antidiarrheal medications to control the diarrhea.  Reiterated that the main  thing is to stay hydrated and to avoid dehydration.  Agrees with plan of care discussed today. Understands warning signs to seek further care:  Vomiting nonstop, blood in the vomit, blood in the stools, fever, unable to keep fluids down,  any significant change in health status. Understands to follow-up if symptoms do not improve.  She has appointments for her twins on Monday, she will reschedule so that she is not in the office while sick.  Covid test from yesterday, is pending.   Pecolia Ades, FNP-C 05/03/2020

## 2020-05-08 ENCOUNTER — Telehealth: Payer: Self-pay

## 2020-05-08 NOTE — Telephone Encounter (Signed)
I can do a in person visit tomorrow if she is able to come-this can be in office thank you

## 2020-05-08 NOTE — Telephone Encounter (Signed)
Pt had virtual dr appt this past Thursday and she is still have stomach problems and wants to know if she needs something called in or does she need to make another appt.  Pt call back [phone]

## 2020-05-08 NOTE — Telephone Encounter (Signed)
Please call pt and schedule for tomorrow per dr Nicki Reaper

## 2020-05-08 NOTE — Telephone Encounter (Signed)
Tried immodium last Sunday and it did slow up the diarrhea a lot. But having a lot of gas issues and when she has bm it is still more like diarrhea. Did not take immodium today and went 2 -3 times. When taking immodium she went about twice a day. No fever, no abdominal pain more like gas pain, no blood in stool. Started 11/3. Went to urgent care on the 4th. Had covid test at urgent care that was negative. Saw karen on the 5th. Having some nausea but states that is not abnormal for her. Has zofran for nausea but has not felt that she needs to take it.  [place].

## 2020-05-09 ENCOUNTER — Ambulatory Visit: Payer: Managed Care, Other (non HMO) | Admitting: Family Medicine

## 2020-05-10 NOTE — Telephone Encounter (Signed)
No show appointment after being worked -in for 11/11.

## 2021-02-19 ENCOUNTER — Telehealth: Payer: Self-pay | Admitting: *Deleted

## 2021-02-19 NOTE — Telephone Encounter (Signed)
Supportive measures is the best approach currently.  If shortness of breath or severe illness would want to get checked out.  Stay isolated from others outside of the house for at least 5 days then after 5 days if feeling fine may be around others if necessary but only if wearing a mask for the additional 5 days  Call if any issues

## 2021-02-19 NOTE — Telephone Encounter (Signed)
Husband tested positive for Covid yesterday and she tested positive for Covid today- patient states she fees cold and her throat feels scratchy and he back and chest sore- no SOB or fever

## 2021-02-19 NOTE — Telephone Encounter (Signed)
Patient advised per Dr Nicki Reaper: Supportive measures is the best approach currently.  If shortness of breath or severe illness would want to get checked out.  Stay isolated from others outside of the house for at least 5 days then after 5 days if feeling fine may be around others if necessary but only if wearing a mask for the additional 5 days   Call if any issues  Patient verbalized understanding.

## 2021-06-12 ENCOUNTER — Other Ambulatory Visit: Payer: Self-pay

## 2021-06-12 ENCOUNTER — Ambulatory Visit: Payer: Managed Care, Other (non HMO) | Admitting: Family Medicine

## 2021-06-12 VITALS — HR 101 | Temp 99.4°F | Wt 115.6 lb

## 2021-06-12 DIAGNOSIS — B349 Viral infection, unspecified: Secondary | ICD-10-CM

## 2021-06-12 NOTE — Progress Notes (Signed)
° °  Subjective:    Patient ID: Brianna Steele, female    DOB: [birth year], 30 y.o.   MRN: [id number]  Fever  This is a new problem. Episode onset: Monday. Associated symptoms include congestion, coughing and ear pain.  Febrile illness along with head congestion drainage coughing ear pain sore throat not feeling good symptoms over the past 3 days no wheezing or difficulty breathing other family member sick with similar symptoms Low grade temp  Review of Systems  Constitutional:  Positive for fever.  HENT:  Positive for congestion and ear pain.   Respiratory:  Positive for cough.       Objective:   Physical Exam  Gen-NAD not toxic TMS-normal bilateral T- normal no redness Chest-CTA respiratory rate normal no crackles CV RRR no murmur Skin-warm dry Neuro-grossly normal  Not toxic no respiratory distress     Assessment & Plan:  Viral syndrome Possible flulike We are testing the children therefore we will not test for Supportive measures Antibiotics not indicated Stay at home next few days No need for x-rays or lab work

## 2021-06-16 ENCOUNTER — Telehealth: Payer: Self-pay | Admitting: Family Medicine

## 2021-06-16 ENCOUNTER — Other Ambulatory Visit: Payer: Self-pay | Admitting: Family Medicine

## 2021-06-16 MED ORDER — DOXYCYCLINE HYCLATE 100 MG PO TABS: 100.0000 mg | ORAL_TABLET | Freq: Two times a day (BID) | ORAL | 0 refills | Status: DC

## 2021-06-16 NOTE — Telephone Encounter (Signed)
Patient verbalized understanding and stated she does not want Amoxicillin because her mother is highly allergic to it and she is also requesting a small pill  [place]

## 2021-06-16 NOTE — Telephone Encounter (Signed)
Illnesses are certainly very challenging for all patients and we have sympathy for this.  The other day when we saw her we felt that this was a viral illness.  At that time the ear pain was referred pain from her throat.  If she feels this is getting worse we can go ahead with treating with amoxicillin 500 mg 1 taken 3 times daily for 7 days.  This would cover for any sign of any ear infection.  Other symptoms pretty much just have to run their own course.  If having ongoing troubles or worsening illness would need to be rechecked

## 2021-06-16 NOTE — Telephone Encounter (Signed)
Patient was seen 12/15 and states still having cough and ear pain . Please advise

## 2021-06-16 NOTE — Telephone Encounter (Signed)
I chose doxycycline which is not related to amoxicillin or penicillin.  It is not recommended to breast-feed when on this medicine.  Take with a snack tall glass of water

## 2021-06-17 NOTE — Telephone Encounter (Signed)
Patient notified and verbalized understanding. 

## 2021-07-03 ENCOUNTER — Encounter: Payer: Self-pay | Admitting: Family Medicine

## 2021-07-03 ENCOUNTER — Ambulatory Visit: Payer: Managed Care, Other (non HMO) | Admitting: Family Medicine

## 2021-07-03 ENCOUNTER — Other Ambulatory Visit: Payer: Self-pay

## 2021-07-03 VITALS — BP 141/83 | HR 87 | Temp 98.7°F | Wt 118.0 lb

## 2021-07-03 DIAGNOSIS — R0982 Postnasal drip: Secondary | ICD-10-CM | POA: Diagnosis not present

## 2021-07-03 DIAGNOSIS — R051 Acute cough: Secondary | ICD-10-CM | POA: Diagnosis not present

## 2021-07-03 NOTE — Assessment & Plan Note (Signed)
No evidence of bacterial infection.  Her symptoms seem to be coming from postnasal drip.  Associated cough.  Advised use of Zyrtec-D or Claritin-D as well as Flonase.  Supportive care.

## 2021-07-03 NOTE — Patient Instructions (Signed)
Lots of fluids.  Zyrtec D or Claritin D.  OTC Flonase.  Call with concerns/worsening etc.  Take care  Dr. Lacinda Axon

## 2021-07-03 NOTE — Progress Notes (Signed)
Subjective:  Patient ID: Brianna Steele, female    DOB: 1991/02/23  Age: 31 y.o. MRN: [id number]  CC: Chief Complaint  Patient presents with   Cough    Cough, stuffy nose, ear pain, swollen/scratchy throat over the weekend, drainage at night. Left ear-trouble hearing last week. Pt did breathe in strong varnish and relates that to throat.     HPI:  31 year old female presents for evaluation of the above.  Patient was recently treated with antibiotics for respiratory infection.  Finished antibiotic therapy on 12/27.  Patient reports that she continues to have some scratchy throat, postnasal drip, and associated cough.  Ear pain has improved although still feels not fully back to normal.  No fever.  No relieving factors.    Patient Active Problem List   Diagnosis Date Noted   Post-nasal drip 07/03/2021   Dermatofibroma of female breast 07/07/2019    Social Hx   Social History   Socioeconomic History   Marital status: Married    Spouse name: Not on file   Number of children: Not on file   Years of education: Not on file   Highest education level: Not on file  Occupational History   Not on file  Tobacco Use   Smoking status: Never   Smokeless tobacco: Never  Vaping Use   Vaping Use: Never used  Substance and Sexual Activity   Alcohol use: No   Drug use: No   Sexual activity: Yes    Birth control/protection: Pill  Other Topics Concern   Not on file  Social History Narrative   Not on file   Social Determinants of Health   Financial Resource Strain: Not on file  Food Insecurity: Not on file  Transportation Needs: Not on file  Physical Activity: Not on file  Stress: Not on file  Social Connections: Not on file    Review of Systems Per HPI  Objective:  BP (!) 141/83    Pulse 87    Temp 98.7 F (37.1 C)    Wt 118 lb (53.5 kg)    SpO2 100%    BMI 22.30 kg/m   BP/Weight 07/03/2021 06/12/2021 74/07/5954  Systolic BP 387 - 564  Diastolic BP 83 - 59  Wt. (Lbs) 118  115.6 -  BMI 22.3 21.84 -    Physical Exam Constitutional:      General: She is not in acute distress.    Appearance: Normal appearance. She is not ill-appearing.  HENT:     Head: Normocephalic and atraumatic.     Right Ear: Tympanic membrane normal.     Left Ear: Tympanic membrane normal.     Mouth/Throat:     Pharynx: Oropharynx is clear.     Comments: Cobblestoning/evidence of postnasal drip noted. Eyes:     General:        Right eye: No discharge.        Left eye: No discharge.     Conjunctiva/sclera: Conjunctivae normal.  Cardiovascular:     Rate and Rhythm: Normal rate and regular rhythm.  Pulmonary:     Effort: Pulmonary effort is normal.     Breath sounds: Normal breath sounds. No wheezing, rhonchi or rales.  Neurological:     Mental Status: She is alert.  Psychiatric:        Mood and Affect: Mood normal.        Behavior: Behavior normal.    Lab Results  Component Value Date   WBC 11.9 (H) 09/05/2018  HGB 10.5 (L) 09/05/2018   HCT 30.7 (L) 09/05/2018   PLT 131 (L) 09/05/2018   GLUCOSE 94 09/05/2018   ALT 14 09/05/2018   AST 18 09/05/2018   NA 138 09/05/2018   K 3.0 (L) 09/05/2018   CL 108 09/05/2018   CREATININE 0.40 (L) 09/05/2018   BUN <5 (L) 09/05/2018   CO2 21 (L) 09/05/2018     Assessment & Plan:   Problem List Items Addressed This Visit       Other   Post-nasal drip - Primary    No evidence of bacterial infection.  Her symptoms seem to be coming from postnasal drip.  Associated cough.  Advised use of Zyrtec-D or Claritin-D as well as Flonase.  Supportive care.      Other Visit Diagnoses     Acute cough           [place]

## 2021-11-20 ENCOUNTER — Ambulatory Visit: Payer: Managed Care, Other (non HMO) | Admitting: Family Medicine

## 2021-11-20 DIAGNOSIS — J019 Acute sinusitis, unspecified: Secondary | ICD-10-CM

## 2021-11-20 DIAGNOSIS — J329 Chronic sinusitis, unspecified: Secondary | ICD-10-CM | POA: Insufficient documentation

## 2021-11-20 MED ORDER — DOXYCYCLINE HYCLATE 100 MG PO TABS: 100.0000 mg | ORAL_TABLET | Freq: Two times a day (BID) | ORAL | 0 refills | Status: DC

## 2021-11-20 MED ORDER — AZELASTINE HCL 0.1 % NA SOLN: 2.0000 | Freq: Two times a day (BID) | NASAL | 3 refills | Status: DC

## 2021-11-20 NOTE — Patient Instructions (Signed)
Medication as prescribed. ° °Call with concerns. ° °Take care ° °Dr Nataliyah Packham °

## 2021-11-22 NOTE — Assessment & Plan Note (Signed)
Likely viral. Advised use of antihistamine and Astelin. Doxy to be filled if worsens or does not improve.

## 2021-11-22 NOTE — Progress Notes (Signed)
Subjective:  Patient ID: Brianna Steele, female    DOB: 1991-02-18  Age: 31 y.o. MRN: [id number]  CC: Chief Complaint  Patient presents with   Nasal Congestion    Started Sunday night.  Congestion worse at night   Fatigue   burning in chest    Patient thinks it's coming from drainage    HPI:  31 year old female presents for evaluation of the above.  Patient reports that she has been sick since Sunday.  She reports nasal congestion.  Also reports burning in the chest and associated fatigue.  She states her symptoms are worse at night.  No fever.  No relieving factors.  She thinks that this may be due to allergies.  No other associated symptoms.  No other complaints.  Patient Active Problem List   Diagnosis Date Noted   Sinusitis 11/20/2021   Dermatofibroma of female breast 07/07/2019    Social Hx   Social History   Socioeconomic History   Marital status: Married    Spouse name: Not on file   Number of children: Not on file   Years of education: Not on file   Highest education level: Not on file  Occupational History   Not on file  Tobacco Use   Smoking status: Never   Smokeless tobacco: Never  Vaping Use   Vaping Use: Never used  Substance and Sexual Activity   Alcohol use: No   Drug use: No   Sexual activity: Yes    Birth control/protection: Pill  Other Topics Concern   Not on file  Social History Narrative   Not on file   Social Determinants of Health   Financial Resource Strain: Not on file  Food Insecurity: Not on file  Transportation Needs: Not on file  Physical Activity: Not on file  Stress: Not on file  Social Connections: Not on file    Review of Systems Per HPI  Objective:  BP 137/87   Pulse 75   Temp 98.5 F (36.9 C) (Oral)   Wt 122 lb (55.3 kg)   SpO2 100%   BMI 23.05 kg/m      11/20/2021    3:31 PM 07/03/2021    3:07 PM 06/12/2021   10:58 AM  BP/Weight  Systolic BP 765 465   Diastolic BP 87 83   Wt. (Lbs) 122 118 115.6  BMI  23.05 kg/m2 22.3 kg/m2 21.84 kg/m2    Physical Exam Vitals and nursing note reviewed.  Constitutional:      General: She is not in acute distress.    Appearance: Normal appearance.  HENT:     Head: Normocephalic and atraumatic.     Nose: No rhinorrhea.     Mouth/Throat:     Pharynx: Oropharynx is clear.  Eyes:     General:        Right eye: No discharge.        Left eye: No discharge.     Conjunctiva/sclera: Conjunctivae normal.  Cardiovascular:     Rate and Rhythm: Normal rate and regular rhythm.  Pulmonary:     Effort: Pulmonary effort is normal.     Breath sounds: Normal breath sounds. No wheezing, rhonchi or rales.  Neurological:     Mental Status: She is alert.    Lab Results  Component Value Date   WBC 11.9 (H) 09/05/2018   HGB 10.5 (L) 09/05/2018   HCT 30.7 (L) 09/05/2018   PLT 131 (L) 09/05/2018   GLUCOSE 94 09/05/2018   ALT 14  09/05/2018   AST 18 09/05/2018   NA 138 09/05/2018   K 3.0 (L) 09/05/2018   CL 108 09/05/2018   CREATININE 0.40 (L) 09/05/2018   BUN <5 (L) 09/05/2018   CO2 21 (L) 09/05/2018     Assessment & Plan:   Problem List Items Addressed This Visit       Respiratory   Sinusitis    Likely viral. Advised use of antihistamine and Astelin. Doxy to be filled if worsens or does not improve.       Relevant Medications   loratadine-pseudoephedrine (CLARITIN-D 24-HOUR) 10-240 MG 24 hr tablet   doxycycline (VIBRA-TABS) 100 MG tablet   azelastine (ASTELIN) 0.1 % nasal spray    Meds ordered this encounter  Medications   doxycycline (VIBRA-TABS) 100 MG tablet    Sig: Take 1 tablet (100 mg total) by mouth 2 (two) times daily.    Dispense:  14 tablet    Refill:  0   azelastine (ASTELIN) 0.1 % nasal spray    Sig: Place 2 sprays into both nostrils 2 (two) times daily. Use in each nostril as directed    Dispense:  30 mL    Refill:  [place]

## 2022-02-20 ENCOUNTER — Encounter: Payer: Self-pay | Admitting: Nurse Practitioner

## 2022-02-20 ENCOUNTER — Ambulatory Visit (INDEPENDENT_AMBULATORY_CARE_PROVIDER_SITE_OTHER): Payer: Managed Care, Other (non HMO) | Admitting: Nurse Practitioner

## 2022-02-20 VITALS — BP 113/75 | HR 86 | Temp 98.4°F | Ht 61.0 in | Wt 123.0 lb

## 2022-02-20 DIAGNOSIS — Z13220 Encounter for screening for lipoid disorders: Secondary | ICD-10-CM | POA: Diagnosis not present

## 2022-02-20 DIAGNOSIS — Z01419 Encounter for gynecological examination (general) (routine) without abnormal findings: Secondary | ICD-10-CM

## 2022-02-20 NOTE — Progress Notes (Unsigned)
Subjective:    Patient ID: Brianna Steele, female    DOB: [birth year], 31 y.o.   MRN: [id number]  HPI  The patient comes in today for a wellness visit.    A review of their health history was completed.  A review of medications was also completed.  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   Additional concerns: natural remedies for moodiness during cycles, burning sensation in right great toe; present for years; Has done pointe in the past; still wears the shoes at times; no back, hip or knee pain Married, same female sexual partner since last PE at [facility] last year. Last PAP smear normal 01/12/20. Vasectomy for contraceptive Cycles slight irregular but once a month, lasting about 7 days with light to heavy flow. Around the time of her cycle she experiences cramps, hot flashes, headache and mood swings. Would like to try naturals first. Did take a SSRI for a couple of months after her children were born but stopped on her own.  Regular dental exams.  Review of Systems  Constitutional:  Negative for activity change, appetite change and fatigue.  HENT:  Negative for sore throat and trouble swallowing.   Eyes:  Negative for visual disturbance.  Respiratory:  Negative for cough, chest tightness, shortness of breath and wheezing.   Cardiovascular:  Negative for chest pain.  Gastrointestinal:  Negative for abdominal distention, abdominal pain, constipation, diarrhea, nausea and vomiting.  Genitourinary:  Negative for difficulty urinating, dysuria, enuresis, frequency, genital sores, menstrual problem, pelvic pain, urgency and vaginal discharge.  Neurological:  Positive for numbness.       Localized burning and numbness in the right great toe.       02/20/2022   11:50 AM   Depression screen PHQ 2/9  Decreased Interest 0  Down, Depressed, Hopeless 0  PHQ - 2 Score 0  Altered sleeping 0  Tired, decreased energy 3  Change in appetite 0  Feeling bad or failure about yourself  1  Trouble concentrating 0  Moving slowly or fidgety/restless 0  Suicidal thoughts 0  PHQ-9 Score 4  Difficult doing work/chores Not difficult at all      02/20/2022   11:49 AM  GAD 7 : Generalized Anxiety Score  Nervous, Anxious, on Edge 2  Control/stop worrying 0  Worry too much - different things 2  Trouble relaxing 0  Restless 0  Easily annoyed or irritable 3  Afraid - awful might happen 2  Total GAD 7 Score 9  Anxiety Difficulty Somewhat difficult         Objective:   Physical Exam Constitutional:      General: She is not in acute distress.    Appearance: She is well-developed.  Neck:     Thyroid: No thyromegaly.     Trachea: No tracheal deviation.     Comments: Thyroid non tender to palpation. No mass or goiter noted.  Cardiovascular:     Rate and Rhythm: Normal rate and regular rhythm.     Heart sounds: Normal heart sounds. No murmur heard. Pulmonary:     Effort: Pulmonary effort is normal.     Breath sounds: Normal breath sounds.  Chest:  Breasts:    Right: No swelling, inverted nipple, mass, skin change or tenderness.  Left: No swelling, inverted nipple, mass, skin change or tenderness.     Comments: Dense tissue with multiple fibrocystic areas. No dominant masses.  Abdominal:     General: There is no distension.     Palpations: Abdomen is soft.     Tenderness: There is no abdominal tenderness.  Genitourinary:    Comments: External GU: no rashes or lesion. Vagina: no discharge. Cervix normal in appearance. No CMT. Bimanual exam: no tenderness or obvious masses.  Musculoskeletal:     Cervical back: Normal range of motion and neck supple.     Comments: Sensation present right great toe. Nail intact. Strong DP and PT pulses.   Lymphadenopathy:      Cervical: No cervical adenopathy.     Upper Body:     Right upper body: No supraclavicular, axillary or pectoral adenopathy.     Left upper body: No supraclavicular, axillary or pectoral adenopathy.  Skin:    General: Skin is warm and dry.     Findings: No rash.  Neurological:     Mental Status: She is alert and oriented to person, place, and time.  Psychiatric:        Mood and Affect: Mood normal.        Behavior: Behavior normal.        Thought Content: Thought content normal.        Judgment: Judgment normal.    Today's Vitals   02/20/22 1040  BP: 113/75  Pulse: 86  Temp: 98.4 F (36.9 C)  SpO2: 100%  Weight: 123 lb (55.8 kg)  Height: '5\' 1"'$  (1.549 m)   Body mass index is 23.24 kg/m.         Assessment & Plan:  Well woman exam - Plan: CBC with Differential/Platelet, Comprehensive metabolic panel, Lipid panel, TSH  Screening for lipid disorders - Plan: CBC with Differential/Platelet, Comprehensive metabolic panel, Lipid panel, TSH  Continue healthy habits. Labs pending.  Defers hormones for menstrual issues.  Discussed some natural options.  Her mother had breast cancer at age 78. She will try to get more information including type and genetic testing for her next PE. Return in about 1 year (around 02/21/2023) for physical.

## 2022-02-20 NOTE — Patient Instructions (Addendum)
Black cohosh  Anti inflammatory Estroven Soy  [employer]  Any genetic screening? What type of cancer such as estrogen receptor positive?

## 2022-02-21 LAB — CBC WITH DIFFERENTIAL/PLATELET
Basophils Absolute: 0 10*3/uL (ref 0.0–0.2)
Basos: 0 %
EOS (ABSOLUTE): 0.1 10*3/uL (ref 0.0–0.4)
Eos: 1 %
Hematocrit: 41.7 % (ref 34.0–46.6)
Hemoglobin: 14.1 g/dL (ref 11.1–15.9)
Immature Grans (Abs): 0 10*3/uL (ref 0.0–0.1)
Immature Granulocytes: 0 %
Lymphocytes Absolute: 1.6 10*3/uL (ref 0.7–3.1)
Lymphs: 23 %
MCH: 30 pg (ref 26.6–33.0)
MCHC: 33.8 g/dL (ref 31.5–35.7)
MCV: 89 fL (ref 79–97)
Monocytes Absolute: 0.6 10*3/uL (ref 0.1–0.9)
Monocytes: 8 %
Neutrophils Absolute: 5 10*3/uL (ref 1.4–7.0)
Neutrophils: 68 %
Platelets: 158 10*3/uL (ref 150–450)
RBC: 4.7 x10E6/uL (ref 3.77–5.28)
RDW: 13 % (ref 11.7–15.4)
WBC: 7.3 10*3/uL (ref 3.4–10.8)

## 2022-02-21 LAB — COMPREHENSIVE METABOLIC PANEL
ALT: 10 IU/L (ref 0–32)
AST: 16 IU/L (ref 0–40)
Albumin/Globulin Ratio: 2 (ref 1.2–2.2)
Albumin: 4.5 g/dL (ref 3.9–4.9)
Alkaline Phosphatase: 61 IU/L (ref 44–121)
BUN/Creatinine Ratio: 13 (ref 9–23)
BUN: 9 mg/dL (ref 6–20)
Bilirubin Total: 0.3 mg/dL (ref 0.0–1.2)
CO2: 22 mmol/L (ref 20–29)
Calcium: 8.8 mg/dL (ref 8.7–10.2)
Chloride: 103 mmol/L (ref 96–106)
Creatinine, Ser: 0.71 mg/dL (ref 0.57–1.00)
Globulin, Total: 2.2 g/dL (ref 1.5–4.5)
Glucose: 91 mg/dL (ref 70–99)
Potassium: 3.9 mmol/L (ref 3.5–5.2)
Sodium: 139 mmol/L (ref 134–144)
Total Protein: 6.7 g/dL (ref 6.0–8.5)
eGFR: 117 mL/min/{1.73_m2} (ref >59)

## 2022-02-21 LAB — LIPID PANEL
Chol/HDL Ratio: 2.8 ratio (ref 0.0–4.4)
Cholesterol, Total: 181 mg/dL (ref 100–199)
HDL: 64 mg/dL (ref >39)
LDL Chol Calc (NIH): 97 mg/dL (ref 0–99)
Triglycerides: 113 mg/dL (ref 0–149)
VLDL Cholesterol Cal: 20 mg/dL (ref 5–40)

## 2022-02-21 LAB — TSH: TSH: 1.07 u[IU]/mL (ref 0.450–4.500)

## 2022-07-10 ENCOUNTER — Encounter: Payer: Self-pay | Admitting: Nurse Practitioner

## 2022-07-10 ENCOUNTER — Ambulatory Visit: Payer: Managed Care, Other (non HMO) | Admitting: Nurse Practitioner

## 2022-07-10 VITALS — BP 126/80 | HR 75 | Temp 99.2°F | Ht 61.0 in | Wt 125.0 lb

## 2022-07-10 DIAGNOSIS — J019 Acute sinusitis, unspecified: Secondary | ICD-10-CM | POA: Diagnosis not present

## 2022-07-10 DIAGNOSIS — B9689 Other specified bacterial agents as the cause of diseases classified elsewhere: Secondary | ICD-10-CM | POA: Diagnosis not present

## 2022-07-10 NOTE — Progress Notes (Unsigned)
   Subjective:    Patient ID: Brianna Steele, female    DOB: 1990-10-22, 32 y.o.   MRN: [id number]  HPI Sinus congestion and drainage  Sinus headaches intermittent affects hearing Started amoxicillin yesterday rx from e visit  Slight sore throat from drainage Low grade temp  Review of Systems     Objective:   Physical Exam        Assessment & Plan:

## 2022-07-11 ENCOUNTER — Encounter: Payer: Self-pay | Admitting: Nurse Practitioner

## 2022-08-04 ENCOUNTER — Ambulatory Visit: Payer: Managed Care, Other (non HMO) | Admitting: Family Medicine

## 2022-08-04 VITALS — BP 106/63 | Temp 97.4°F | Wt 123.2 lb

## 2022-08-04 DIAGNOSIS — B349 Viral infection, unspecified: Secondary | ICD-10-CM | POA: Diagnosis not present

## 2022-08-04 DIAGNOSIS — L308 Other specified dermatitis: Secondary | ICD-10-CM | POA: Diagnosis not present

## 2022-08-04 MED ORDER — TRIAMCINOLONE ACETONIDE 0.1 % EX CREA: TOPICAL_CREAM | CUTANEOUS | 4 refills | Status: DC

## 2022-08-04 NOTE — Progress Notes (Signed)
   Subjective:    Patient ID: Brianna Steele, female    DOB: 08-Sep-1990, 32 y.o.   MRN: [id number]  HPI  Patient c/o post nasal drip, sinus pressure, fatigue, started Thursday clear to greenish yellow drainage  Started off last week.  Then progressed into head congestion drainage and coughing a little bit of neck pain.  No wheezing or difficulty breathing.  No vomiting or diarrhea.  Energy level now improving.  Breathing better.  Minimal congestion  Admits to over washing her hands Review of Systems     Objective:   Physical Exam Lungs are clear hearts regular HEENT benign neck no masses supple   Severe eczema on the The hands    Assessment & Plan:  Viral syndrome Supportive measures discussed no antibiotics indicated Follow-up if progressive troubles or worse  I have encouraged her to cut way back on washing her hands Eucerin cream as needed Triamcinolone twice daily as needed Use a humidifier Avoid excessive hot water

## 2023-01-21 ENCOUNTER — Telehealth: Payer: Self-pay | Admitting: Family Medicine

## 2023-01-21 NOTE — Telephone Encounter (Signed)
Patient has physical in September with Eber Jones and needing labs

## 2023-01-26 ENCOUNTER — Encounter: Payer: Self-pay | Admitting: Nurse Practitioner

## 2023-01-26 ENCOUNTER — Other Ambulatory Visit: Payer: Self-pay | Admitting: Nurse Practitioner

## 2023-01-26 DIAGNOSIS — Z13228 Encounter for screening for other metabolic disorders: Secondary | ICD-10-CM

## 2023-01-26 DIAGNOSIS — Z0000 Encounter for general adult medical examination without abnormal findings: Secondary | ICD-10-CM

## 2023-01-26 DIAGNOSIS — Z130 Encounter for screening for diseases of the blood and blood-forming organs and certain disorders involving the immune mechanism: Secondary | ICD-10-CM

## 2023-01-26 DIAGNOSIS — Z13220 Encounter for screening for lipoid disorders: Secondary | ICD-10-CM

## 2023-01-26 NOTE — Telephone Encounter (Signed)
Labs ordered and note sent through my chart.

## 2023-02-11 ENCOUNTER — Other Ambulatory Visit: Payer: Managed Care, Other (non HMO)

## 2023-02-11 ENCOUNTER — Encounter: Payer: Managed Care, Other (non HMO) | Admitting: Licensed Clinical Social Worker

## 2023-02-24 ENCOUNTER — Inpatient Hospital Stay: Payer: Managed Care, Other (non HMO) | Attending: Internal Medicine | Admitting: Licensed Clinical Social Worker

## 2023-02-24 ENCOUNTER — Inpatient Hospital Stay: Payer: Managed Care, Other (non HMO)

## 2023-02-24 DIAGNOSIS — Z8481 Family history of carrier of genetic disease: Secondary | ICD-10-CM

## 2023-02-24 DIAGNOSIS — Z803 Family history of malignant neoplasm of breast: Secondary | ICD-10-CM

## 2023-02-24 NOTE — Progress Notes (Signed)
REFERRING PROVIDER: Self-referred  PRIMARY PROVIDER:  Babs Sciara, MD Sherie Don NP)  PRIMARY REASON FOR VISIT:  1. Family history of BRCA2 gene positive   2. Family history of breast cancer      HISTORY OF PRESENT ILLNESS:   Brianna Steele, a 32 y.o. female, was seen for a [place] cancer genetics consultation due to her mother's recent genetic testing which showed a BRCA2 mutation called [phone] ([id number]). Brianna Steele presents to clinic today to discuss the possibility of a hereditary predisposition to cancer, genetic testing, and to further clarify her future cancer risks, as well as potential cancer risks for family members.   CANCER HISTORY:  Brianna Steele is a 32 y.o. female with no personal history of cancer.    RISK FACTORS:  Menarche was at age 65.  First live birth at age 26.  Ovaries intact: yes.  Hysterectomy: no.  Menopausal status: premenopausal.  HRT use: 0 years. Colonoscopy: n/a; Mammogram within the last year: n/a. Number of breast biopsies: 0. Up to date with pelvic exams: yes.  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 Charlton Haws shave biopsy shows dermatofibroma December 2020   Medical history non-contributory     Past Surgical History:  Procedure Laterality Date   TONSILLECTOMY AND ADENOIDECTOMY  04/1998    FAMILY HISTORY:  We obtained a detailed, 4-generation family history.  Significant diagnoses are listed below: Family History  Problem Relation Age of Onset   Cancer Mother 26       breast cancer   Cancer Father 22       testicular   Cancer Paternal Grandmother        breast   Heart disease Paternal Grandfather    Kidney Stones Other    Brianna Steele has twin daughters. She has 2 brothers, no cancers.  Brianna Steele mother had breast cancer at 35 and again this year at 41, she underwent genetic testing and was  found to have a BRCA2 mutation. Maternal great aunt had unknown cancer. Maternal 1st cousin once removed had prostate, another 1st cousin once removed had stomach cancer.  Brianna Steele father had testicular cancer. Paternal grandmother had breast cancer in her 61s-60s and uterine cancer after taking anti-estrogen therapy in her 73s-70s. Paternal uncle had melanoma.   Brianna Steele is aware of previous family history of genetic testing for hereditary cancer risks. There is no reported Ashkenazi Jewish ancestry. There is no known consanguinity.    GENETIC COUNSELING ASSESSMENT: Brianna Steele is a 32 y.o. female with a family history of a BRCA2 mutation.  We, therefore, discussed and recommended the following at today's visit.   DISCUSSION: We discussed that approximately 10% of cancer is hereditary. We discussed the BRCA2 mutation in detail, noting increased breast, ovarian, pancreatic cancer risks and female cancer risks as well. She has a 50% chance to have inherited this mutation from her mother. There are other genes associated with hereditary cancer as well that we can test since there is cancer on her father's side of the family, although the chance of hereditary cancer on that side is low. Cancers and risks are gene specific. We discussed that testing is beneficial for several reasons including knowing about cancer risks, identifying potential screening and risk-reduction options that may be appropriate, and to understand if other family members could be at risk for cancer and allow  them to undergo genetic testing.   We reviewed the characteristics, features and inheritance patterns of hereditary cancer syndromes. We also discussed genetic testing, including the appropriate family members to test, the process of testing, insurance coverage and turn-around-time for results. We discussed the implications of a negative, positive and/or variant of uncertain significant result. We recommended Brianna Steele pursue genetic  testing for the [facility] Common Hereditary Cancers+RNA gene panel.   Based on Brianna Steele's family history of a BRCA2 mutation, she meets medical criteria for genetic testing. Despite that she meets criteria, she may still have an out of pocket cost. We discussed that if her out of pocket cost for testing is over $100, the laboratory will call and confirm whether she wants to proceed with testing.  If the out of pocket cost of testing is less than $100 she will be billed by the genetic testing laboratory.   PLAN: After considering the risks, benefits, and limitations, Brianna Steele provided informed consent to pursue genetic testing and the blood sample was sent to [facility] for analysis of the Common Hereditary Cancers+RNA panel. Results should be available within approximately 2-3 weeks' time, at which point they will be disclosed by telephone to Brianna Steele, as will any additional recommendations warranted by these results. Brianna Steele will receive a summary of her genetic counseling visit and a copy of her results once available. This information will also be available in Epic.   Brianna Steele questions were answered to her satisfaction today. Our contact information was provided should additional questions or concerns arise. Thank you for the referral and allowing [place] to share in the care of your patient.   Brianna Duverney, MS, [facility] Genetic Counselor [email or web address] Phone: [phone]  The patient was seen for a total of 25 minutes in face-to-face genetic counseling. Patient's mother Brianna Steele was also present.  Dr. Blake Divine was available for discussion regarding this case.   _______________________________________________________________________ For Office Staff:  Number of people involved in session: 2 Was an Intern/ student involved with case: no

## 2023-03-04 ENCOUNTER — Encounter: Payer: Managed Care, Other (non HMO) | Admitting: Nurse Practitioner

## 2023-03-08 ENCOUNTER — Ambulatory Visit: Payer: Self-pay | Admitting: Licensed Clinical Social Worker

## 2023-03-08 ENCOUNTER — Telehealth: Payer: Self-pay | Admitting: Licensed Clinical Social Worker

## 2023-03-08 ENCOUNTER — Encounter: Payer: Self-pay | Admitting: Licensed Clinical Social Worker

## 2023-03-08 DIAGNOSIS — Z1379 Encounter for other screening for genetic and chromosomal anomalies: Secondary | ICD-10-CM | POA: Insufficient documentation

## 2023-03-08 DIAGNOSIS — Z1501 Genetic susceptibility to malignant neoplasm of breast: Secondary | ICD-10-CM

## 2023-03-08 NOTE — Progress Notes (Signed)
Genetic Test Results - BRCA2+  HPI:   Brianna Steele. Wrzesinski was previously seen in the [facility] due to a family history of BRCA2 mutation and concerns regarding a hereditary predisposition to cancer. Please refer to our prior cancer genetics clinic note for more information regarding our discussion, assessment and recommendations, at the time. Brianna Steele. Nile recent genetic test results were disclosed to her, as were recommendations warranted by these results. These results and recommendations are discussed in more detail below.  CANCER HISTORY:  Oncology History   No history exists.    FAMILY HISTORY:  We obtained a detailed, 4-generation family history.  Significant diagnoses are listed below: Family History  Problem Relation Age of Onset   Cancer Mother 37       breast cancer   Cancer Father 28       testicular   Cancer Paternal Grandmother        breast   Heart disease Paternal Grandfather    Kidney Stones Other    Brianna Steele. Kim has twin daughters. She has 2 brothers, no cancers.   Brianna Steele. Desmet mother had breast cancer at 13 and again this year at 23, she underwent genetic testing and was found to have a BRCA2 mutation. Maternal great aunt had unknown cancer. Maternal 1st cousin once removed had prostate, another 1st cousin once removed had stomach cancer.   Brianna Steele. Loven father had testicular cancer. Paternal grandmother had breast cancer in her 68s-60s and uterine cancer after taking anti-estrogen therapy in her 19s-70s. Paternal uncle had melanoma.    Brianna Steele. Willcut is aware of previous family history of genetic testing for hereditary cancer risks. There is no reported Ashkenazi Jewish ancestry. There is no known consanguinity.      GENETIC TEST RESULTS:  Brianna Steele. Sotto tested positive for a single pathogenic variant (harmful genetic change) in the BRCA2 gene. Specifically, this variant is [phone] ([id number]). The remainder of testing was normal.  The Common Hereditary  Cancers Panel + RNA offered by [facility] includes sequencing and/or deletion duplication testing of the following 48 genes: APC*, ATM*, AXIN2, BAP1, BARD1, BMPR1A, BRCA1, BRCA2, BRIP1, CDH1, CDK4, CDKN2A (p14ARF), CDKN2A (p16INK4a), CHEK2, CTNNA1, DICER1*, EPCAM*, FH*, GREM1*, HOXB13, KIT, MBD4, MEN1*, MLH1*, MSH2*, MSH3*, MSH6*, MUTYH, NF1*, NTHL1, PALB2, PDGFRA, PMS2*, POLD1*, POLE, PTEN*, RAD51C, RAD51D, SDHA*, SDHB, SDHC*, SDHD, SMAD4, SMARCA4, STK11, TP53, TSC1*, TSC2, VHL.   The test report has been scanned into EPIC and is located under the Molecular Pathology section of the Results Review tab.  A portion of the result report is included below for reference. Genetic testing reported out on 03/05/2023.     Clinical Information: Hereditary breast and ovarian cancer (HBOC) syndrome is characterized by an increased lifetime risk for, generally, adult-onset cancers including, breast, contralateral breast, female breast, ovarian, prostate, melanoma and pancreatic.  The cancers associated with BRCA2 are: Female breast cancer, up to an 84% risk In women with a history of breast cancer, the risk for contralateral breast cancer 10 years after breast cancer diagnosis is 10-30%.  Female breast cancer, up to an 8% risk Ovarian cancer, 13-29% risk Pancreatic cancer, 5-10% risk Prostate cancer, 19-61% risk Melanoma, elevated risk   Management Recommendations:  Breast Screening/Risk Reduction:  Women: Breast awareness starting at age 72 Clinical breast examination every 6-12 months starting at age 37  Breast cancer screening: Age 60-29 years, annual breast MRI with and without contrast (or mammogram, if MRI is unavailable), although the age to initiate screening may be individualized based  on family history Age 29-75 years, annual mammogram and breast MRI with and without contrast Age >75 years, management should be considered on an individual basis For women with a BRCA2 pathogenic or likely pathogenic  variant who are treated for breast cancer and have not had a bilateral mastectomy, screening with annual mammogram and breast MRI should continue as described above. The option of prophylactic bilateral risk-reducing mastectomy (RRM), removal of the breast tissue before cancer develops, is the best option for significantly decreasing the risk of developing breast cancer. Studies have shown mastectomies reduce the risk of breast cancer by 90-95% in women with a BRCA2 mutation.   Males: Breast self-exam training and education starting at age 53 years Annual clinical breast exam starting at age 62 years  Consider annual mammogram starting at age 71 or 10 years before the earliest known female breast cancer in the family (whichever comes first).   Gynecological Cancer Screening/Risk Reduction: It is recommended that women with a BRCA2 mutation have a risk-reducing salpingo oophorectomy (RRSO), removal of the ovaries and fallopian tubes. It is reasonable to delay RRSO until age 85-45 years unless age at diagnosis in the family warrants earlier age for consideration of RRSO.  Having a RRSO is estimated to reduce the risk of ovarian cancer by up to 96%. There is still a small risk of developing an "ovarian-like" cancer in the lining of the abdomen, called the peritoneum. Another benefit to having the ovaries removed is the risk reduction for breast cancer. If the ovaries are removed before menopause, the risk of developing breast cancer is reduced. Ovarian cancer screening is an option for women who chose not to have a RRSO or who have not yet completed their family. Current screening methods for ovarian cancer are neither sensitive nor specific, meaning that often early stage ovarian cancer cannot be diagnosed through this screening.  Screening can also be falsely positive with no cancer present. For this reason, RRSO is recommended over screening. If ovarian cancer screening is recommended by a physician, it  could include: CA-125 blood tests Transvaginal ultrasounds Clinical pelvic exams   Skin Cancer Screening and Risk Reduction: Regular skin self-examinations Individuals should notify their physicians of any changes to moles such as increasing in size, darkening in color, or other change in appearance. Annual skin examinations by a dermatologist  Follow sun-safety recommendations such as: Using UVA and UVB 30 SPF or higher sunscreen Avoiding sunburns Limiting sun exposure, especially during the hours of 11am-4pm  Wearing protective clothing and sunglasses Avoid using tanning beds For more information about the prevention of melanoma visit [email or web address]   Prostate Cancer Screening: Annual digital rectal exam (DRE) at age 14 Annual PSA blood test at age 76  Pancreatic Cancer Screening/Risk Reduction: Avoid smoking, heavy alcohol use, and obesity. It has been suggested that pancreatic cancer screening be limited to those with a family history of pancreatic cancer (first- or second-degree relative). Ideally, screening should be performed in experienced centers utilizing a multidisciplinary approach under research conditions. Recommended screening could include annual endoscopic ultrasound (preferred) and/or MRI of the pancreas starting at age 49 or 16 years younger than the earliest age diagnosis in the family.  Additional Considerations: Individuals at risk for developing breast and ovarian cancer may benefit from the use of medication to reduce their risk for cancer. These medications are referred to as chemoprevention. For example, oral contraceptive use has been shown to reduce the risk of ovarian cancer by approximately 60% in BRCA2 mutation carriers if  taken for at least 5 years. This risk reduction remains even after discontinuation of oral contraceptives. Recent studies have suggested PARP inhibitors may be a beneficial chemotherapeutic agent for a subset of patients with  BRCA2-associated breast, ovarian, prostate, and pancreatic cancers. Clinical trials are currently in process to determine if and how these agents can be useful in the treatment of BRCA2 cancer patients Patients of reproductive age should be made aware of options for prenatal diagnosis and assisted reproduction including pre-implantation genetic diagnosis. Individuals with a single pathogenic BRCA2 variant are carriers of Fanconi anemia. Fanconi anemia is characterized by developmental delay apparent from infancy, short stature, microcephaly, and coarse dysmorphic features. For there to be a risk of Fanconi anemia in offspring, both the patient and their partner would each have to carry a pathogenic variant in BRCA2. In this case, the risk of having an affected child is 25%.    This information is based on current understanding of the gene and may change in the future.   Implications for Family Members: Hereditary predisposition to cancer due to pathogenic variants in the BRCA2 gene has autosomal dominant inheritance. This means that an individual with a pathogenic variant has a 50% chance of passing the condition on to his/her offspring. Identification of a pathogenic variant allows for the recognition of at-risk relatives who can pursue testing for the familial variant.  Family members are encouraged to consider genetic testing for this familial pathogenic variant. As there are generally no childhood cancer risks associated with a single pathogenic variant in the BRCA2 gene, individuals in the family are not recommended to have testing until they reach at least 33 years of age. They may contact our office at [phone] for more information or to schedule an appointment. Complimentary testing for the familial variant is available for 150 days after the genetic testing report date.  Family members who live outside of the area are encouraged to find a genetic counselor in their area by visiting:  [email or web address].  Resources: FORCE (Facing Our Risk of Cancer Empowered) is a resource for those with a hereditary predisposition to develop cancer.  FORCE provides information about risk reduction, advocacy, legislation, and clinical trials.  Additionally, FORCE provides a platform for collaboration and support; which includes: peer navigation, message boards, local support groups, a toll-free helpline, research registry and recruitment, advocate training, published medical research, webinars, brochures, mastectomy photos, and more.  For more information, visit www.facingourrisk.org  PLAN: 1. These results will be made available to  her PCP and she will be referred to the high risk breast clinic at the [facility]. She would like these providers to follow her long-term for this indication and coordinate screening/prophylactic surgeries.    2. Brianna Steele. Shattles plans to discuss these results with her family and will reach out to [place] if we can be of any assistance in coordinating genetic testing for any of her relatives.   We encouraged Brianna Steele. Ganger to remain in contact with [place] on an annual basis so we can update her personal and family histories, and let her know of advances in cancer genetics that may benefit the family. Our contact number was provided. Brianna Steele. Grable questions were answered to her satisfaction today, and she knows she is welcome to call anytime with additional questions.   Brianna Duverney, Brianna Steele, Brianna Steele Phone: [phone]

## 2023-03-08 NOTE — Telephone Encounter (Signed)
I contacted Ms. Stickley to discuss her genetic testing results. 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 Remainder of testing was normal.  Detailed clinic note to follow.   The test report has been scanned into EPIC and is located under the Molecular Pathology section of the Results Review tab.  A portion of the result report is included below for reference.      Lacy Duverney, MS, [facility] Genetic Counselor [email or web address] Phone: [phone]

## 2023-03-15 ENCOUNTER — Encounter: Payer: Self-pay | Admitting: Oncology

## 2023-03-15 ENCOUNTER — Inpatient Hospital Stay: Payer: Managed Care, Other (non HMO)

## 2023-03-15 ENCOUNTER — Inpatient Hospital Stay: Payer: Managed Care, Other (non HMO) | Attending: Internal Medicine | Admitting: Oncology

## 2023-03-15 VITALS — BP 121/87 | HR 78 | Temp 98.4°F | Resp 18 | Ht 61.0 in | Wt 126.3 lb

## 2023-03-15 DIAGNOSIS — Z803 Family history of malignant neoplasm of breast: Secondary | ICD-10-CM | POA: Diagnosis not present

## 2023-03-15 DIAGNOSIS — Z1509 Genetic susceptibility to other malignant neoplasm: Secondary | ICD-10-CM | POA: Diagnosis not present

## 2023-03-15 DIAGNOSIS — Z1501 Genetic susceptibility to malignant neoplasm of breast: Secondary | ICD-10-CM | POA: Diagnosis not present

## 2023-03-15 NOTE — Progress Notes (Signed)
Hematology/Oncology Consult note [facility] Telephone:([phone] [phone]  Patient Care Team: Babs Sciara, MD as PCP - General (Family Medicine)   Name of the patient: Brianna Steele  [id number]  28-Dec-1990    Reason for referral-referred to high risk breast clinic for BRCA 2 positivity   Referring provider: Lacy Duverney  Date of visit: 03/15/23   History of presenting illness- Patient is a 32 year old female with no personal history of breast cancer.  Her mother was diagnosed with breast cancer at the age of 14 and again at the age of 80.  Her mother underwent genetic testing as and was found to have BRCA2 gene mutation.  Patient subsequently tested positive as well.  She has twin daughters.  Patient has 2 brothers and none of them have been diagnosed with cancer.  No family history of pancreatic cancer or melanoma.  Patient has never had any prior mammograms or breast biopsies.  ECOG PS- 0  Pain scale- 0   Review of systems- Review of Systems  Constitutional:  Negative for chills, fever, malaise/fatigue and weight loss.  HENT:  Negative for congestion, ear discharge and nosebleeds.   Eyes:  Negative for blurred vision.  Respiratory:  Negative for cough, hemoptysis, sputum production, shortness of breath and wheezing.   Cardiovascular:  Negative for chest pain, palpitations, orthopnea and claudication.  Gastrointestinal:  Negative for abdominal pain, blood in stool, constipation, diarrhea, heartburn, melena, nausea and vomiting.  Genitourinary:  Negative for dysuria, flank pain, frequency, hematuria and urgency.  Musculoskeletal:  Negative for back pain, joint pain and myalgias.  Skin:  Negative for rash.  Neurological:  Negative for dizziness, tingling, focal weakness, seizures, weakness and headaches.  Endo/Heme/Allergies:  Does not bruise/bleed easily.  Psychiatric/Behavioral:  Negative for depression and suicidal ideas. The patient  does not have insomnia.     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 Charlton Haws shave biopsy shows dermatofibroma December 2020   Medical history non-contributory      Past Surgical History:  Procedure Laterality Date   TONSILLECTOMY AND ADENOIDECTOMY  04/1998    Social History   Socioeconomic History   Marital status: Married    Spouse name: Not on file   Number of children: Not on file   Years of education: Not on file   Highest education level: Not on file  Occupational History   Not on file  Tobacco Use   Smoking status: Never   Smokeless tobacco: Never  Vaping Use   Vaping status: Never Used  Substance and Sexual Activity   Alcohol use: No   Drug use: No   Sexual activity: Yes    Birth control/protection: Pill  Other Topics Concern   Not on file  Social History Narrative   Not on file   Social Determinants of Health   Financial Resource Strain: Low Risk  (08/05/2018)   Overall Financial Resource Strain (CARDIA)    Difficulty of Paying Living Expenses: Not hard at all  Food Insecurity: No Food Insecurity (08/05/2018)  Hunger Vital Sign    Worried About Running Out of Food in the Last Year: Never true    Ran Out of Food in the Last Year: Never true  Transportation Needs: No Transportation Needs (08/05/2018)   PRAPARE - Administrator, Civil Service (Medical): No    Lack of Transportation (Non-Medical): No  Physical Activity: Insufficiently Active (08/05/2018)   Exercise Vital Sign    Days of Exercise per Week: 3 days    Minutes of  Exercise per Session: 30 min  Stress: No Stress Concern Present (08/05/2018)   [employer] of Occupational Health - Occupational Stress Questionnaire    Feeling of Stress : Not at all  Social Connections: Unknown (08/05/2018)   Social Connection and Isolation Panel [NHANES]    Frequency of Communication with Friends and Family: Three times a week    Frequency of Social Gatherings with Friends and Family: Three times a week    Attends Religious Services: 1 to 4 times per year    Active Member of Clubs or Organizations: No    Attends Banker Meetings: Never    Marital Status: Not on file  Intimate Partner Violence: Not At Risk (08/05/2018)   Humiliation, Afraid, Rape, and Kick questionnaire    Fear of Current or Ex-Partner: No    Emotionally Abused: No    Physically Abused: No    Sexually Abused: No     Family History  Problem Relation Age of Onset   Cancer Mother 44       breast cancer   Cancer Father 13       testicular   Cancer Paternal Grandmother        breast   Heart disease Paternal Grandfather    Kidney Stones Other      Current Outpatient Medications:    amoxicillin (AMOXIL) 400 MG/5ML suspension, SMARTSIG:11 Milliliter(s) By Mouth Twice Daily, Disp: , Rfl:    triamcinolone cream (KENALOG) 0.1 %, Apply thin amount bid prn, Disp: 453 g, Rfl: 4   Physical exam:  Vitals:   03/15/23 1044  BP: 121/87  Pulse: 78  Resp: 18  Temp: 98.4 F (36.9 C)  TempSrc: Tympanic  SpO2: 100%  Weight: 126 lb 4.8 oz (57.3 kg)  Height: 5\' 1"  (1.549 m)   Physical Exam Cardiovascular:     Rate and Rhythm: Normal rate and regular rhythm.     Heart sounds: Normal heart sounds.  Pulmonary:     Effort: Pulmonary effort is normal.     Breath sounds: Normal breath sounds.  Skin:    General: Skin is warm and dry.  Neurological:     Mental Status: She is alert and oriented to person, place, and time.   Breast exam: No palpable masses in bilateral breast.  No palpable  bilateral axillary adenopathy.       Latest Ref Rng & Units 02/20/2022   11:52 AM  CMP  Glucose 70 - 99 mg/dL 91   BUN 6 - 20 mg/dL 9   Creatinine 1.61 - 0.96 mg/dL 0.45   Sodium 409 - 811 mmol/L 139   Potassium 3.5 - 5.2 mmol/L 3.9   Chloride 96 - 106 mmol/L 103   CO2 20 - 29 mmol/L 22   Calcium 8.7 - 10.2 mg/dL 8.8   Total Protein 6.0 - 8.5 g/dL 6.7   Total Bilirubin 0.0 - 1.2 mg/dL 0.3   Alkaline Phos 44 - 121 IU/L 61   AST 0 - 40 IU/L  16   ALT 0 - 32 IU/L 10       Latest Ref Rng & Units 02/20/2022   11:52 AM  CBC  WBC 3.4 - 10.8 x10E3/uL 7.3   Hemoglobin 11.1 - 15.9 g/dL 69.6   Hematocrit 29.5 - 46.6 % 41.7   Platelets 150 - 450 x10E3/uL 158     Assessment and plan- Patient is a 32 y.o. female referred for BRCA2 gene positivity  Breast cancer risk:BRCA2 mutation carriers are associated with a lifetime breast cancer risk of about 69% this cancer incidence RICE continues up to 40 to 50 years for BRCA2 carriers.  In both retrospective and prospective studies risk-reducing or prophylactic bilateral mastectomy decreases incidence of breast cancer by 90% or more in patients with hereditary breast cancer risk.  However it does not eliminate the risk completely.  Patient is strongly considering going for bilateral mastectomy next year.  In the meanwhile I would recommend getting a mammogram now and alternating MRIs with mammograms.  Although tamoxifen can be considered for chemoprevention since patient is strongly considering going for mastectomy next year I think we can hold off on tamoxifen.  Moreover patient herself is hesitant to start tamoxifen at this time for chemoprevention.  Discussed risks and benefits of tamoxifen including all but not limited to possible risk of hot flashes mood swings, increased risk of DVT and endometrial cancer.  I would also recommend self breast exams and I will see her every 6 months for breast exams as well.  2.  Ovarian cancer risk:Lifetime risk of  ovarian cancer and BRCA2 mutation carriers is about 17%.  Risk reduction bilateral salpingo-oophorectomy could be potentially delayed up until 39 to 33 years of age for BRCA2 carriers.  I will reach out to GYN oncology as well if they need to see her at this time for surveillance for ovarian cancer.Also discussed with the patient and there is still a potential risk of peritoneal carcinoma despite a bilateral salpingo-oophorectomy.I would not recommend oral contraceptive as chemoprevention agents for ovarian cancer risk reduction in the absence of risk reduction bilateral salpingo-oophorectomy at this time there is a theoretical risk of increased breast cancer risk  Pancreatic cancer risk: There is no family history of pancreatic cancer and therefore patient would not particularly benefit from pancreatic cancer screening at this time.  Mammogram now and MRI in 6 months and I will see her thereafter.  Patient comprehends my plan well   Thank you for this kind referral and the opportunity to participate in the care of this patient   Visit Diagnosis 1. BRCA2 gene mutation positive     Dr. Owens Shark, MD, MPH [facility] [phone] 03/15/2023

## 2023-03-23 ENCOUNTER — Ambulatory Visit
Admission: RE | Admit: 2023-03-23 | Discharge: 2023-03-23 | Disposition: A | Discharge status: Home / Self Care | Payer: Managed Care, Other (non HMO) | Source: Ambulatory Visit | Attending: Oncology | Admitting: Oncology

## 2023-03-23 DIAGNOSIS — Z1501 Genetic susceptibility to malignant neoplasm of breast: Secondary | ICD-10-CM | POA: Insufficient documentation

## 2023-03-23 DIAGNOSIS — Z1509 Genetic susceptibility to other malignant neoplasm: Secondary | ICD-10-CM | POA: Insufficient documentation

## 2023-03-23 DIAGNOSIS — Z1231 Encounter for screening mammogram for malignant neoplasm of breast: Secondary | ICD-10-CM | POA: Insufficient documentation

## 2023-04-01 LAB — COMPREHENSIVE METABOLIC PANEL
ALT: 15 [IU]/L (ref 0–32)
AST: 17 [IU]/L (ref 0–40)
Albumin: 4.3 g/dL (ref 3.9–4.9)
Alkaline Phosphatase: 65 [IU]/L (ref 44–121)
BUN/Creatinine Ratio: 15 (ref 9–23)
BUN: 13 mg/dL (ref 6–20)
Bilirubin Total: 0.2 mg/dL (ref 0.0–1.2)
CO2: 27 mmol/L (ref 20–29)
Calcium: 9.3 mg/dL (ref 8.7–10.2)
Chloride: 104 mmol/L (ref 96–106)
Creatinine, Ser: 0.85 mg/dL (ref 0.57–1.00)
Globulin, Total: 2.5 g/dL (ref 1.5–4.5)
Glucose: 107 mg/dL — ABNORMAL HIGH (ref 70–99)
Potassium: 5.4 mmol/L — ABNORMAL HIGH (ref 3.5–5.2)
Sodium: 138 mmol/L (ref 134–144)
Total Protein: 6.8 g/dL (ref 6.0–8.5)
eGFR: 93 mL/min/{1.73_m2} (ref >59)

## 2023-04-01 LAB — CBC WITH DIFFERENTIAL/PLATELET
Basophils Absolute: 0 10*3/uL (ref 0.0–0.2)
Basos: 1 %
EOS (ABSOLUTE): 0.1 10*3/uL (ref 0.0–0.4)
Eos: 2 %
Hematocrit: 40.6 % (ref 34.0–46.6)
Hemoglobin: 13.3 g/dL (ref 11.1–15.9)
Immature Grans (Abs): 0 10*3/uL (ref 0.0–0.1)
Immature Granulocytes: 0 %
Lymphocytes Absolute: 2.1 10*3/uL (ref 0.7–3.1)
Lymphs: 33 %
MCH: 30.4 pg (ref 26.6–33.0)
MCHC: 32.8 g/dL (ref 31.5–35.7)
MCV: 93 fL (ref 79–97)
Monocytes Absolute: 0.6 10*3/uL (ref 0.1–0.9)
Monocytes: 9 %
Neutrophils Absolute: 3.7 10*3/uL (ref 1.4–7.0)
Neutrophils: 55 %
Platelets: 178 10*3/uL (ref 150–450)
RBC: 4.37 x10E6/uL (ref 3.77–5.28)
RDW: 12.3 % (ref 11.7–15.4)
WBC: 6.6 10*3/uL (ref 3.4–10.8)

## 2023-04-01 LAB — LIPID PANEL
Chol/HDL Ratio: 3.4 {ratio} (ref 0.0–4.4)
Cholesterol, Total: 192 mg/dL (ref 100–199)
HDL: 56 mg/dL (ref >39)
LDL Chol Calc (NIH): 118 mg/dL — ABNORMAL HIGH (ref 0–99)
Triglycerides: 101 mg/dL (ref 0–149)
VLDL Cholesterol Cal: 18 mg/dL (ref 5–40)

## 2023-04-06 ENCOUNTER — Ambulatory Visit (INDEPENDENT_AMBULATORY_CARE_PROVIDER_SITE_OTHER): Payer: Managed Care, Other (non HMO) | Admitting: Family Medicine

## 2023-04-06 VITALS — BP 124/80 | HR 88 | Temp 99.9°F | Wt 126.2 lb

## 2023-04-06 DIAGNOSIS — R21 Rash and other nonspecific skin eruption: Secondary | ICD-10-CM | POA: Diagnosis not present

## 2023-04-06 NOTE — Progress Notes (Signed)
Significant contact dermatitis on the hands with eczema.  This been an ongoing issue.  She also has a rash on the left forearm where to round red areas concerned about tinea There is no pus drainage from any of these areas No fever no pain or discomfort She was using Neosporin on the area on her arm and then it got more red The area on the arm is consistent with a Neosporin reaction recommend staying away from that Also I find no evidence of bacterial infection I think this more eczema on the hands as well as allergic reaction on the arm May use triamcinolone twice daily as needed if any ongoing troubles or problems follow-up

## 2023-04-08 ENCOUNTER — Encounter: Payer: Managed Care, Other (non HMO) | Admitting: Nurse Practitioner

## 2023-04-11 ENCOUNTER — Encounter: Payer: Self-pay | Admitting: Family Medicine

## 2023-04-12 ENCOUNTER — Encounter: Payer: Self-pay | Admitting: Nurse Practitioner

## 2023-04-14 ENCOUNTER — Ambulatory Visit: Payer: Managed Care, Other (non HMO) | Admitting: Family Medicine

## 2023-04-14 DIAGNOSIS — L089 Local infection of the skin and subcutaneous tissue, unspecified: Secondary | ICD-10-CM

## 2023-04-14 DIAGNOSIS — B958 Unspecified staphylococcus as the cause of diseases classified elsewhere: Secondary | ICD-10-CM

## 2023-04-14 NOTE — Progress Notes (Signed)
   Subjective:    Patient ID: Brianna Steele, female    DOB: [birth year], 32 y.o.   MRN: [id number]  HPI Patient's daughter recently had scalded skin syndrome due to Staphylococcus infection Patient herself had 3 separate bulla and was treated with Keflex through a urgent care center Culture is still pending These areas seem to be improving   Review of Systems     Objective:   Physical Exam The areas on the hands and also arm do appear to be improving should gradually get better       Assessment & Plan:   Scalded skin syndrome is not apparent-patient has staph skin infection currently-should gradually get better no intervention necessary other than the finish out the antibiotics warning signs were discussed

## 2023-06-29 ENCOUNTER — Telehealth: Payer: Self-pay | Admitting: *Deleted

## 2023-06-29 ENCOUNTER — Other Ambulatory Visit: Payer: Self-pay | Admitting: Nurse Practitioner

## 2023-06-29 DIAGNOSIS — R7301 Impaired fasting glucose: Secondary | ICD-10-CM

## 2023-06-29 DIAGNOSIS — Z1329 Encounter for screening for other suspected endocrine disorder: Secondary | ICD-10-CM

## 2023-06-29 DIAGNOSIS — E875 Hyperkalemia: Secondary | ICD-10-CM

## 2023-06-29 NOTE — Telephone Encounter (Signed)
 Copied from CRM [id number]. Topic: Clinical - Request for Lab/Test Order >> Jun 29, 2023  8:59 AM Gildardo Pounds wrote: Reason for CRM: Patient is has an appt on 07/14/2022 and need labs done before the appt.  Call back number [phone]

## 2023-06-29 NOTE — Telephone Encounter (Signed)
 Campbell Riches, NP     Her lipid was fine back in October. Repeated her metabolic profile and ordered thyroid test. Thanks.

## 2023-07-01 ENCOUNTER — Ambulatory Visit: Payer: Managed Care, Other (non HMO) | Admitting: Nurse Practitioner

## 2023-07-02 ENCOUNTER — Encounter: Payer: Self-pay | Admitting: *Deleted

## 2023-07-02 NOTE — Telephone Encounter (Signed)
 Patient notified via mychart

## 2023-07-06 ENCOUNTER — Ambulatory Visit: Payer: Managed Care, Other (non HMO) | Admitting: Nurse Practitioner

## 2023-07-06 LAB — COMPREHENSIVE METABOLIC PANEL
ALT: 17 [IU]/L (ref 0–32)
AST: 17 [IU]/L (ref 0–40)
Albumin: 4.5 g/dL (ref 3.9–4.9)
Alkaline Phosphatase: 65 [IU]/L (ref 44–121)
BUN/Creatinine Ratio: 13 (ref 9–23)
BUN: 9 mg/dL (ref 6–20)
Bilirubin Total: 0.3 mg/dL (ref 0.0–1.2)
CO2: 21 mmol/L (ref 20–29)
Calcium: 9.1 mg/dL (ref 8.7–10.2)
Chloride: 105 mmol/L (ref 96–106)
Creatinine, Ser: 0.72 mg/dL (ref 0.57–1.00)
Globulin, Total: 1.8 g/dL (ref 1.5–4.5)
Glucose: 85 mg/dL (ref 70–99)
Potassium: 4.3 mmol/L (ref 3.5–5.2)
Sodium: 141 mmol/L (ref 134–144)
Total Protein: 6.3 g/dL (ref 6.0–8.5)
eGFR: 113 mL/min/{1.73_m2} (ref >59)

## 2023-07-06 LAB — TSH: TSH: 1.4 u[IU]/mL (ref 0.450–4.500)

## 2023-07-15 ENCOUNTER — Ambulatory Visit: Payer: Managed Care, Other (non HMO) | Admitting: Nurse Practitioner

## 2023-07-15 VITALS — BP 122/76 | HR 86 | Temp 100.2°F | Ht 61.0 in | Wt 133.2 lb

## 2023-07-15 DIAGNOSIS — Z01419 Encounter for gynecological examination (general) (routine) without abnormal findings: Secondary | ICD-10-CM

## 2023-07-15 DIAGNOSIS — Z1501 Genetic susceptibility to malignant neoplasm of breast: Secondary | ICD-10-CM

## 2023-07-15 DIAGNOSIS — Z1151 Encounter for screening for human papillomavirus (HPV): Secondary | ICD-10-CM

## 2023-07-15 DIAGNOSIS — Z124 Encounter for screening for malignant neoplasm of cervix: Secondary | ICD-10-CM

## 2023-07-15 DIAGNOSIS — Z1283 Encounter for screening for malignant neoplasm of skin: Secondary | ICD-10-CM

## 2023-07-15 DIAGNOSIS — Z01411 Encounter for gynecological examination (general) (routine) with abnormal findings: Secondary | ICD-10-CM

## 2023-07-15 NOTE — Progress Notes (Signed)
Subjective:    Patient ID: Brianna Steele, female    DOB: 12-27-1990, 33 y.o.   MRN: [id number]  HPI The patient comes in today for a wellness visit.    A review of their health history was completed.  A review of medications was also completed.  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   Additional concerns: Would like referral for Dermatologist for skin cancer screening; has family history of melanoma Takes daily vitamins. Averages about 6 hours of sleep per night.  Does wake up frequently early in the morning.  States she drinks coffee "all day". Same female sexual partner.  He has had a vasectomy.  Her cycles are regular averaging about 7 days with 3 to 4 days of slightly heavy flow.    Review of Systems  Constitutional:  Negative for activity change, appetite change and fatigue.  HENT:  Negative for sore throat and trouble swallowing.   Respiratory:  Negative for cough, chest tightness, shortness of breath and wheezing.   Cardiovascular:  Negative for chest pain.  Gastrointestinal:  Negative for abdominal distention, abdominal pain, constipation, diarrhea, nausea and vomiting.  Genitourinary:  Negative for difficulty urinating, dysuria, enuresis, frequency, genital sores, menstrual problem, pelvic pain, urgency and vaginal discharge.      07/15/2023    9:17 AM  Depression screen PHQ 2/9  Decreased Interest 0  Down, Depressed, Hopeless 0  PHQ - 2 Score 0  Altered sleeping 0  Tired, decreased energy 0  Change in appetite 0  Feeling bad or failure about yourself  0  Trouble concentrating 0  Moving slowly or fidgety/restless 0  Suicidal thoughts 0  PHQ-9 Score 0  Difficult doing work/chores Not  difficult at all      07/15/2023    9:17 AM 02/20/2022   11:49 AM  GAD 7 : Generalized Anxiety Score  Nervous, Anxious, on Edge 0 2  Control/stop worrying 0 0  Worry too much - different things 0 2  Trouble relaxing 0 0  Restless 0 0  Easily annoyed or irritable 0 3  Afraid - awful might happen 0 2  Total GAD 7 Score 0 9  Anxiety Difficulty Not difficult at all Somewhat difficult         Objective:   Physical Exam Vitals and nursing note reviewed. Chaperone present: Defers chaperone.  Constitutional:      General: She is not in acute distress.    Appearance: She is well-developed.  Neck:     Thyroid: No thyromegaly.     Trachea: No tracheal deviation.     Comments: Thyroid non tender to palpation. No mass or goiter noted.  Cardiovascular:     Rate and Rhythm: Normal rate and regular rhythm.     Heart sounds: Normal heart sounds. No murmur heard. Pulmonary:     Effort: Pulmonary effort is normal.     Breath sounds: Normal breath sounds.  Chest:  Breasts:    Right: No swelling, inverted nipple, mass, skin change or tenderness.     Left: No swelling, inverted nipple, mass, skin change or tenderness.  Abdominal:     General: There is no distension.     Palpations: Abdomen is soft.  Tenderness: There is no abdominal tenderness.  Genitourinary:    General: Normal vulva.     Exam position: Lithotomy position.     Labia:        Right: No rash, tenderness or lesion.        Left: No rash, tenderness or lesion.      Vagina: No vaginal discharge, erythema, bleeding or lesions.     Cervix: No cervical motion tenderness, discharge, friability, lesion, erythema, cervical bleeding or eversion.     Comments: Bimanual exam: No tenderness or obvious masses. Musculoskeletal:     Cervical back: Normal range of motion and neck supple.  Lymphadenopathy:     Cervical: No cervical adenopathy.     Upper Body:     Right upper body: No supraclavicular, axillary or pectoral adenopathy.      Left upper body: No supraclavicular, axillary or pectoral adenopathy.  Skin:    General: Skin is warm and dry.  Neurological:     Mental Status: She is alert and oriented to person, place, and time.  Psychiatric:        Mood and Affect: Mood normal.        Behavior: Behavior normal.        Thought Content: Thought content normal.        Judgment: Judgment normal.      Results for orders placed or performed in visit on 06/29/23  Comprehensive metabolic panel   Collection Time: 07/05/23  8:11 AM  Result Value Ref Range   Glucose 85 70 - 99 mg/dL   BUN 9 6 - 20 mg/dL   Creatinine, Ser 6.64 0.57 - 1.00 mg/dL   eGFR 403 >47 QQ/VZD/6.38   BUN/Creatinine Ratio 13 9 - 23   Sodium 141 134 - 144 mmol/L   Potassium 4.3 3.5 - 5.2 mmol/L   Chloride 105 96 - 106 mmol/L   CO2 21 20 - 29 mmol/L   Calcium 9.1 8.7 - 10.2 mg/dL   Total Protein 6.3 6.0 - 8.5 g/dL   Albumin 4.5 3.9 - 4.9 g/dL   Globulin, Total 1.8 1.5 - 4.5 g/dL   Bilirubin Total 0.3 0.0 - 1.2 mg/dL   Alkaline Phosphatase 65 44 - 121 IU/L   AST 17 0 - 40 IU/L   ALT 17 0 - 32 IU/L  TSH   Collection Time: 07/05/23  8:11 AM  Result Value Ref Range   TSH 1.400 0.450 - 4.500 uIU/mL        Assessment & Plan:   Problem List Items Addressed This Visit       Other   BRCA2 gene mutation positive   Other Visit Diagnoses       Well woman exam    -  Primary     Screening for cervical cancer       Relevant Orders   IGP, Aptima HPV     Screening for HPV (human papillomavirus)       Relevant Orders   IGP, Aptima HPV     Skin cancer screening       Relevant Orders   Ambulatory referral to Dermatology      Continue activity and healthy diet.  Recommend that she hold on caffeinated products starting in the afternoons to see if this will help sleep. Continue follow-up with oncology for BRCA positive genetic testing. Reviewed recent labs with patient and discussed any concerns. Referred to dermatologist in [facility]  for skin cancer screening. Return in about 1 year (around 07/14/2024)  for physical.

## 2023-07-16 ENCOUNTER — Encounter: Payer: Self-pay | Admitting: Nurse Practitioner

## 2023-07-22 LAB — IGP, APTIMA HPV: HPV Aptima: NEGATIVE

## 2023-07-23 ENCOUNTER — Encounter: Payer: Self-pay | Admitting: Nurse Practitioner

## 2023-09-09 ENCOUNTER — Other Ambulatory Visit: Payer: Self-pay | Admitting: *Deleted

## 2023-09-09 ENCOUNTER — Telehealth: Payer: Self-pay | Admitting: Oncology

## 2023-09-09 DIAGNOSIS — Z1501 Genetic susceptibility to malignant neoplasm of breast: Secondary | ICD-10-CM

## 2023-09-09 DIAGNOSIS — Z803 Family history of malignant neoplasm of breast: Secondary | ICD-10-CM

## 2023-09-09 NOTE — Telephone Encounter (Signed)
 Called patients husband after several attempts to reach wife with no result. Gave him the number to schedule MRI at [facility]. He states he will pass the number to patient.

## 2023-09-13 ENCOUNTER — Ambulatory Visit: Payer: Managed Care, Other (non HMO)

## 2023-09-27 ENCOUNTER — Inpatient Hospital Stay: Payer: Managed Care, Other (non HMO) | Admitting: Oncology

## 2023-10-05 ENCOUNTER — Other Ambulatory Visit: Payer: Self-pay

## 2023-10-05 ENCOUNTER — Telehealth: Payer: Self-pay | Admitting: *Deleted

## 2023-10-09 ENCOUNTER — Ambulatory Visit
Admission: RE | Admit: 2023-10-09 | Discharge: 2023-10-09 | Disposition: A | Discharge status: Home / Self Care | Source: Ambulatory Visit | Attending: Oncology | Admitting: Oncology

## 2023-10-09 DIAGNOSIS — Z1501 Genetic susceptibility to malignant neoplasm of breast: Secondary | ICD-10-CM

## 2023-10-09 DIAGNOSIS — Z803 Family history of malignant neoplasm of breast: Secondary | ICD-10-CM

## 2023-10-09 MED ORDER — GADOPICLENOL 0.5 MMOL/ML IV SOLN
6.0000 mL | Freq: Once | INTRAVENOUS | Status: AC | PRN
  Administered 2023-10-09: 6 mL via INTRAVENOUS

## 2023-10-13 ENCOUNTER — Encounter: Payer: Self-pay | Admitting: Oncology

## 2023-10-13 ENCOUNTER — Inpatient Hospital Stay: Attending: Oncology | Admitting: Oncology

## 2023-10-13 ENCOUNTER — Other Ambulatory Visit: Payer: Self-pay

## 2023-10-13 DIAGNOSIS — Z8043 Family history of malignant neoplasm of testis: Secondary | ICD-10-CM | POA: Diagnosis not present

## 2023-10-13 DIAGNOSIS — Z803 Family history of malignant neoplasm of breast: Secondary | ICD-10-CM | POA: Diagnosis not present

## 2023-10-13 DIAGNOSIS — Z1509 Genetic susceptibility to other malignant neoplasm: Secondary | ICD-10-CM | POA: Diagnosis not present

## 2023-10-13 DIAGNOSIS — Z1501 Genetic susceptibility to malignant neoplasm of breast: Secondary | ICD-10-CM

## 2023-10-13 NOTE — Progress Notes (Signed)
 Hematology/Oncology Consult note [facility]  Telephone:([phone] [phone]  Patient Care Team: Babs Sciara, MD as PCP - General (Family Medicine) Creig Hines, MD as Consulting Physician (Oncology)   Name of the patient: Brianna Steele  [id number]  1990-08-13   Date of visit: 10/13/23  Diagnosis-  BRCA2 gene positivity. no personal history of breast cancer  Chief complaint/ Reason for visit-routine follow-up visit for high risk for breast cancer  Heme/Onc history: Patient is a 32 year old female with no personal history of breast cancer.  Her mother was diagnosed with breast cancer at the age of 31 and again at the age of 41.  Her mother underwent genetic testing as and was found to have BRCA2 gene mutation.  Patient subsequently tested positive as well.  She has twin daughters.  Patient has 2 brothers and none of them have been diagnosed with cancer.  No family history of pancreatic cancer or melanoma.  Patient has never had any prior mammograms or breast biopsies.   Patient has been getting mammograms alternating with MRIs until she decides to pursue bilateral mastectomy.  She will be also referred to GYN oncology down the line to discuss risk reduction salpingo-oophorectomy.  Interval history-she feels well overall and denies any complaints at this time.  She is willing to move forward with surgical discussion for bilateral mastectomy  ECOG PS- 0 Pain scale- 0   Review of systems- Review of Systems  Constitutional:  Negative for chills, fever, malaise/fatigue and weight loss.  HENT:  Negative for congestion, ear discharge and nosebleeds.   Eyes:  Negative for blurred vision.  Respiratory:  Negative for cough, hemoptysis, sputum production, shortness of breath and wheezing.   Cardiovascular:  Negative for chest pain, palpitations, orthopnea and claudication.  Gastrointestinal:  Negative for abdominal pain, blood in stool, constipation,  diarrhea, heartburn, melena, nausea and vomiting.  Genitourinary:  Negative for dysuria, flank pain, frequency, hematuria and urgency.  Musculoskeletal:  Negative for back pain, joint pain and myalgias.  Skin:  Negative for rash.  Neurological:  Negative for dizziness, tingling, focal weakness, seizures, weakness and headaches.  Endo/Heme/Allergies:  Does not bruise/bleed easily.  Psychiatric/Behavioral:  Negative for depression and suicidal ideas. The patient does not have insomnia.       Allergies  Allergen Reactions   Neosporin [Bacitracin-Polymyxin B]     Contact redness after using Neosporin for several days   Tape Rash     Past Medical History:  Diagnosis Date   Depression    Dermatofibroma of female breast 07/07/2019   Patient had a bump upper aspect of breast saw dermatology Dr. Charlton Haws shave biopsy shows dermatofibroma December 2020   Medical history non-contributory      Past Surgical History:  Procedure Laterality Date   TONSILLECTOMY AND ADENOIDECTOMY  04/1998    Social History   Socioeconomic History   Marital status: Married    Spouse name: Not on file   Number of children: Not on file   Years of education: Not on file   Highest education level: Not on file  Occupational History   Not on file  Tobacco Use   Smoking status: Never   Smokeless tobacco: Never  Vaping Use   Vaping status: Never Used  Substance and Sexual Activity   Alcohol use: No   Drug use: No   Sexual activity: Yes    Birth control/protection: Pill  Other Topics Concern   Not on file  Social History Narrative  Not on file   Social Drivers of Health   Financial Resource Strain: Patient Declined (07/15/2023)   Overall Financial Resource Strain (CARDIA)    Difficulty of Paying Living Expenses: Patient declined  Food Insecurity: Patient Declined (07/15/2023)   Hunger Vital Sign    Worried About Running Out of Food in the Last Year: Patient declined    Ran Out of Food in the Last  Year: Patient declined  Transportation Needs: No Transportation Needs (07/15/2023)   PRAPARE - Administrator, Civil Service (Medical): No    Lack of Transportation (Non-Medical): No  Physical Activity: Unknown (07/15/2023)   Exercise Vital Sign    Days of Exercise per Week: 0 days    Minutes of Exercise per Session: Not on file  Stress: No Stress Concern Present (07/15/2023)   [employer] of Occupational Health - Occupational Stress Questionnaire    Feeling of Stress : Not at all  Social Connections: Moderately Isolated (07/15/2023)   Social Connection and Isolation Panel [NHANES]    Frequency of Communication with Friends and Family: Once a week    Frequency of Social Gatherings with Friends and Family: Once a week    Attends Religious Services: More than 4 times per year    Active Member of [employer] or Organizations: No    Attends Banker Meetings: Not on file    Marital Status: Married  Catering manager Violence: Not At Risk (08/05/2018)   Humiliation, Afraid, Rape, and Kick questionnaire    Fear of Current or Ex-Partner: No    Emotionally Abused: No    Physically Abused: No    Sexually Abused: No    Family History  Problem Relation Age of Onset   Cancer Mother 22       breast cancer   Cancer Father 26       testicular   Cancer Paternal Grandmother        breast   Heart disease Paternal Grandfather    Kidney Stones Other      Current Outpatient Medications:    Pediatric Multivit-Minerals (FLINTSTONES COMPLETE) CHEW, Chew by mouth., Disp: , Rfl:    triamcinolone cream (KENALOG) 0.1 %, Apply thin amount bid prn, Disp: 453 g, Rfl: 4  Physical exam:  Vitals:   10/13/23 1119  BP: 112/83  Pulse: 69  Resp: 18  Temp: 98 F (36.7 C)  TempSrc: Tympanic  SpO2: 100%  Weight: 132 lb (59.9 kg)  Height: 5\' 1"  (1.549 m)   Physical Exam Cardiovascular:     Rate and Rhythm: Normal rate and regular rhythm.     Heart sounds: Normal heart sounds.   Pulmonary:     Effort: Pulmonary effort is normal.     Breath sounds: Normal breath sounds.  Skin:    General: Skin is warm and dry.  Neurological:     Mental Status: She is alert and oriented to person, place, and time.   Breast exam: No palpable masses in either breast.  No palpable bilateral axillary adenopathy.  I have personally reviewed labs listed below:    Latest Ref Rng & Units 07/05/2023    8:11 AM  CMP  Glucose 70 - 99 mg/dL 85   BUN 6 - 20 mg/dL 9   Creatinine 6.57 - 8.46 mg/dL 9.62   Sodium 952 - 841 mmol/L 141   Potassium 3.5 - 5.2 mmol/L 4.3   Chloride 96 - 106 mmol/L 105   CO2 20 - 29 mmol/L 21  Calcium 8.7 - 10.2 mg/dL 9.1   Total Protein 6.0 - 8.5 g/dL 6.3   Total Bilirubin 0.0 - 1.2 mg/dL 0.3   Alkaline Phos 44 - 121 IU/L 65   AST 0 - 40 IU/L 17   ALT 0 - 32 IU/L 17       Latest Ref Rng & Units 03/31/2023    3:56 PM  CBC  WBC 3.4 - 10.8 x10E3/uL 6.6   Hemoglobin 11.1 - 15.9 g/dL 16.1   Hematocrit 09.6 - 46.6 % 40.6   Platelets 150 - 450 x10E3/uL 178    I have personally reviewed Radiology images listed below: No images are attached to the encounter.  MR BREAST BILATERAL W WO CONTRAST INC CAD Result Date: 10/11/2023 CLINICAL DATA:  High risk screening breast MRI. BRCA positive. Family history breast cancer in mother premenopausal and grandmother postmenopausal. EXAM: BILATERAL BREAST MRI WITH AND WITHOUT CONTRAST TECHNIQUE: Multiplanar, multisequence MR images of both breasts were obtained prior to and following the intravenous administration of 6 ml of Vueway. Three-dimensional MR images were rendered by post-processing of the original MR data on an independent workstation. The three-dimensional MR images were interpreted, and findings are reported in the following complete MRI report for this study. Three dimensional images were evaluated at the independent interpreting workstation using the DynaCAD thin client. COMPARISON:  Mammogram 03/23/2023 FINDINGS:  Breast composition: d. Extreme fibroglandular tissue. Background parenchymal enhancement: Moderate. Right breast: No suspicious masses or abnormal enhancement. Left breast: No suspicious masses or abnormal enhancement. Lymph nodes: No abnormal appearing lymph nodes. Ancillary findings:  None. IMPRESSION: Unremarkable breast MRI without evidence of malignancy. RECOMMENDATION: Recommend continued annual bilateral 3D screening mammography. Also recommend continued annual high risk screening breast MRI. BI-RADS CATEGORY  1: Negative. Electronically Signed   By: Roda Cirri M.D.   On: 10/11/2023 10:34     Assessment and plan- Patient is a 33 y.o. female with history of BRCA2 gene positivity here for routine visit for breast cancer surveillance  MRI from April 2025 did not show any evidence of Abnormal breast masses.  On clinical exam today her breast exam is normal.  I am referring her to Dr. Delane Fear for consideration of prophylactic bilateral mastectomy in the setting of BRCA2 gene positivity.  Patient is willing to move forward with surgical discussion.  I am ordering a mammogram tentatively for September 2025.  But if she has surgery in the interim that can be canceled.  Patients with BRCA2 gene mutation also have a lifetime risk of ovarian cancer about 17%.  Although risk reduction surgery can be potentially delayed up to 46 to 34 years of age I will consider GYN oncology referral when I see her back in 6 months.  I did discuss her case with GYN oncology today and they have a potential clinical trial [id number] opening up at [facility] specifically for patients with BRCA2 gene mutation looking at risk reducing salpingectomy with delayed risk reducing oophorectomy versus upfront risk reducing salpingo-oophorectomy.  I we will see her back in 6 months   Visit Diagnosis 1. BRCA2 gene mutation positive      Dr. Seretha Dance, MD, MPH [facility] [phone] 10/13/2023 12:59  PM

## 2023-10-26 ENCOUNTER — Encounter: Payer: Self-pay | Admitting: *Deleted

## 2023-10-26 ENCOUNTER — Telehealth: Payer: Self-pay | Admitting: *Deleted

## 2023-10-26 NOTE — Telephone Encounter (Signed)
 I see that the referral was put in to see Loda on 4/16. Patient not head from anyone. Want to get an appt for her to get things going.Dr. Randy Buttery told me to send herself secure chat and she will take it over

## 2023-10-26 NOTE — Progress Notes (Signed)
 Referral sent to Dr. Delane Fear to discuss prophylactic bil mastectomies for BRCA2 mutation.   His office will call her with the appt.

## 2023-10-27 ENCOUNTER — Encounter: Payer: Self-pay | Admitting: *Deleted

## 2023-10-27 NOTE — Progress Notes (Signed)
 Appt. Scheduled for 5/29 with Dr. Delane Fear to discuss prophylactic mastectomies.   She is aware of appt. Details.

## 2023-10-28 ENCOUNTER — Telehealth: Payer: Self-pay

## 2023-10-28 NOTE — Telephone Encounter (Signed)
 Outbound call to [facility] to follow up on referral for Dr. Eduard Grad for consideration of prophylactic bilateral mastectomy in the setting of BRCA2 gene positivity.  Spoke with Fermin Hove who confirmed patient has an appointment scheduled 11/25/23 at 9:10AM.

## 2023-12-08 ENCOUNTER — Telehealth: Payer: Self-pay | Admitting: *Deleted

## 2023-12-08 NOTE — Telephone Encounter (Signed)
 Patient called and said that she had an appointment on October 13, 2023 she paid $50 and she has a bill that they sent out saying $220.  He says that she has a card says that she paid 50 but then when she got the bill it showed only paid $30 of the 220.Aaron Aas  She thinks it is because she still is with [employer] but in the beginning of the year they changed the number.  The information she does not have the card of the insurance.  It is [id number]  02  and Grp [id number],. I called the billing number and spoke to a lady and she says that she says 30 dollars and I gave her the insurance info and I asked her to call the patient,

## 2023-12-23 ENCOUNTER — Other Ambulatory Visit: Payer: Self-pay | Admitting: General Surgery

## 2024-01-14 ENCOUNTER — Encounter: Payer: Self-pay | Admitting: Advanced Practice Midwife

## 2024-02-01 ENCOUNTER — Telehealth: Payer: Self-pay | Admitting: *Deleted

## 2024-02-01 NOTE — Telephone Encounter (Signed)
 Patient wanted the number for the billing because she is having an issue in one of her.  I gave her the [phone]

## 2024-02-10 ENCOUNTER — Telehealth: Payer: Self-pay | Admitting: Oncology

## 2024-02-10 NOTE — Telephone Encounter (Signed)
 Pt has mammogram scheduled for mid/end of Sept. Pt just scheduled surgery for breast for end of Oct. She was calling to see if Melanee still wants her to have mammogram or if she would be okay to skip it since she is having surgery a month later. She requested a call back at [phone] [facility]

## 2024-02-10 NOTE — Telephone Encounter (Signed)
 Called to notify pt of appt being canceled. Dr Melanee said (per secure chat) that she was okay to skip her mammogram due to surgery. Pt didn't have vm set up - will message via mychart - LH

## 2024-03-23 ENCOUNTER — Encounter

## 2024-04-17 ENCOUNTER — Inpatient Hospital Stay: Attending: Oncology | Admitting: Oncology

## 2024-04-17 ENCOUNTER — Encounter: Payer: Self-pay | Admitting: Oncology

## 2024-04-17 ENCOUNTER — Encounter (HOSPITAL_BASED_OUTPATIENT_CLINIC_OR_DEPARTMENT_OTHER): Payer: Self-pay | Admitting: General Surgery

## 2024-04-17 VITALS — BP 112/80 | HR 74 | Temp 96.0°F | Resp 18 | Ht 61.0 in | Wt 137.5 lb

## 2024-04-17 DIAGNOSIS — Z1502 Genetic susceptibility to malignant neoplasm of ovary: Secondary | ICD-10-CM | POA: Insufficient documentation

## 2024-04-17 DIAGNOSIS — Z1589 Genetic susceptibility to other disease: Secondary | ICD-10-CM | POA: Diagnosis not present

## 2024-04-17 DIAGNOSIS — Z1501 Genetic susceptibility to malignant neoplasm of breast: Secondary | ICD-10-CM | POA: Diagnosis not present

## 2024-04-17 DIAGNOSIS — Z1509 Genetic susceptibility to other malignant neoplasm: Secondary | ICD-10-CM

## 2024-04-17 DIAGNOSIS — Z15068 Genetic susceptibility to other malignant neoplasm of digestive system: Secondary | ICD-10-CM | POA: Diagnosis not present

## 2024-04-17 DIAGNOSIS — Z8043 Family history of malignant neoplasm of testis: Secondary | ICD-10-CM | POA: Insufficient documentation

## 2024-04-17 DIAGNOSIS — Z803 Family history of malignant neoplasm of breast: Secondary | ICD-10-CM | POA: Insufficient documentation

## 2024-04-17 DIAGNOSIS — Z1507 Genetic susceptibility to malignant neoplasm of urinary tract: Secondary | ICD-10-CM

## 2024-04-17 NOTE — Progress Notes (Signed)
 Hematology/Oncology Consult note [facility]  [phone] [phone]  Patient Care Team: Alphonsa Glendia LABOR, MD as PCP - General (Family Medicine) Melanee Annah BROCKS, MD as Consulting Physician (Oncology)   Name of the patient: Brianna Steele  [id number]  05-Nov-1990   Date of visit: 04/17/24  Diagnosis- BRCA2 gene positivity. no personal history of breast cancer   Chief complaint/ Reason for visit-routine follow-up visit for high risk of breast cancer  Heme/Onc history: Patient is a 33 year old female with no personal history of breast cancer.  Her mother was diagnosed with breast cancer at the age of 64 and again at the age of 21.  Her mother underwent genetic testing as and was found to have BRCA2 gene mutation.  Patient subsequently tested positive as well.  She has twin daughters.  Patient has 2 brothers and none of them have been diagnosed with cancer.  No family history of pancreatic cancer or melanoma.  Patient has never had any prior mammograms or breast biopsies.    Patient has been getting mammograms alternating with MRIs until she decides to pursue bilateral mastectomy.  She will be also referred to GYN oncology down the line to discuss risk reduction salpingo-oophorectomy.  Interval history-she is doing well presently and denies any complaints.  She will be undergoing bilateral mastectomy without reconstruction end of this month with Dr. Ebbie  ECOG PS- 0 Pain scale- 0   Review of systems- Review of Systems  Constitutional:  Negative for chills, fever, malaise/fatigue and weight loss.  HENT:  Negative for congestion, ear discharge and nosebleeds.   Eyes:  Negative for blurred vision.  Respiratory:  Negative for cough, hemoptysis, sputum production, shortness of breath and wheezing.   Cardiovascular:  Negative for chest pain, palpitations, orthopnea and claudication.  Gastrointestinal:  Negative for abdominal pain, blood in stool,  constipation, diarrhea, heartburn, melena, nausea and vomiting.  Genitourinary:  Negative for dysuria, flank pain, frequency, hematuria and urgency.  Musculoskeletal:  Negative for back pain, joint pain and myalgias.  Skin:  Negative for rash.  Neurological:  Negative for dizziness, tingling, focal weakness, seizures, weakness and headaches.  Endo/Heme/Allergies:  Does not bruise/bleed easily.  Psychiatric/Behavioral:  Negative for depression and suicidal ideas. The patient does not have insomnia.       Allergies  Allergen Reactions   Neosporin [Bacitracin-Polymyxin B]     Contact redness after using Neosporin for several days   Tape Rash     Past Medical History:  Diagnosis Date   Depression    Dermatofibroma of female breast 07/07/2019   Patient had a bump upper aspect of breast saw dermatology Dr. Junior shave biopsy shows dermatofibroma December 2020   Medical history non-contributory      Past Surgical History:  Procedure Laterality Date   TONSILLECTOMY AND ADENOIDECTOMY  04/1998    Social History   Socioeconomic History   Marital status: Married    Spouse name: Not on file   Number of children: Not on file   Years of education: Not on file   Highest education level: Not on file  Occupational History   Not on file  Tobacco Use   Smoking status: Never   Smokeless tobacco: Never  Vaping Use   Vaping status: Never Used  Substance and Sexual Activity   Alcohol use: No   Drug use: No   Sexual activity: Yes    Birth control/protection: Pill  Other Topics Concern   Not on file  Social  History Narrative   Not on file   Social Drivers of Health   Financial Resource Strain: Patient Declined (07/15/2023)   Overall Financial Resource Strain (CARDIA)    Difficulty of Paying Living Expenses: Patient declined  Food Insecurity: Patient Declined (07/15/2023)   Hunger Vital Sign    Worried About Running Out of Food in the Last Year: Patient declined    Ran Out of  Food in the Last Year: Patient declined  Transportation Needs: No Transportation Needs (07/15/2023)   PRAPARE - Administrator, Civil Service (Medical): No    Lack of Transportation (Non-Medical): No  Physical Activity: Unknown (07/15/2023)   Exercise Vital Sign    Days of Exercise per Week: 0 days    Minutes of Exercise per Session: Not on file  Stress: No Stress Concern Present (07/15/2023)   [employer] of Occupational Health - Occupational Stress Questionnaire    Feeling of Stress : Not at all  Social Connections: Moderately Isolated (07/15/2023)   Social Connection and Isolation Panel    Frequency of Communication with Friends and Family: Once a week    Frequency of Social Gatherings with Friends and Family: Once a week    Attends Religious Services: More than 4 times per year    Active Member of [employer] or Organizations: No    Attends Banker Meetings: Not on file    Marital Status: Married  Catering manager Violence: Not At Risk (08/05/2018)   Humiliation, Afraid, Rape, and Kick questionnaire    Fear of Current or Ex-Partner: No    Emotionally Abused: No    Physically Abused: No    Sexually Abused: No    Family History  Problem Relation Age of Onset   Cancer Mother 19       breast cancer   Cancer Father 20       testicular   Cancer Paternal Grandmother        breast   Heart disease Paternal Grandfather    Kidney Stones Other      Current Outpatient Medications:    Pediatric Multivit-Minerals (FLINTSTONES COMPLETE) CHEW, Chew by mouth., Disp: , Rfl:    triamcinolone  cream (KENALOG ) 0.1 %, Apply thin amount bid prn, Disp: 453 g, Rfl: 4  Physical exam:  Vitals:   04/17/24 1012  BP: 112/80  Pulse: 74  Resp: 18  Temp: (!) 96 F (35.6 C)  TempSrc: Tympanic  SpO2: 100%  Weight: 137 lb 8 oz (62.4 kg)  Height: 5' 1 (1.549 m)   Physical Exam Cardiovascular:     Rate and Rhythm: Normal rate and regular rhythm.     Heart sounds: Normal  heart sounds.  Pulmonary:     Effort: Pulmonary effort is normal.     Breath sounds: Normal breath sounds.  Skin:    General: Skin is warm and dry.  Neurological:     Mental Status: She is alert and oriented to person, place, and time.      I have personally reviewed labs listed below:    Latest Ref Rng & Units 07/05/2023    8:11 AM  CMP  Glucose 70 - 99 mg/dL 85   BUN 6 - 20 mg/dL 9   Creatinine 9.42 - 8.99 mg/dL 9.27   Sodium 865 - 855 mmol/L 141   Potassium 3.5 - 5.2 mmol/L 4.3   Chloride 96 - 106 mmol/L 105   CO2 20 - 29 mmol/L 21   Calcium 8.7 - 10.2 mg/dL 9.1  Total Protein 6.0 - 8.5 g/dL 6.3   Total Bilirubin 0.0 - 1.2 mg/dL 0.3   Alkaline Phos 44 - 121 IU/L 65   AST 0 - 40 IU/L 17   ALT 0 - 32 IU/L 17       Latest Ref Rng & Units 03/31/2023    3:56 PM  CBC  WBC 3.4 - 10.8 x10E3/uL 6.6   Hemoglobin 11.1 - 15.9 g/dL 86.6   Hematocrit 65.9 - 46.6 % 40.6   Platelets 150 - 450 x10E3/uL 178      Assessment and plan- Patient is a 33 y.o. female with history of BRCA2 gene positivity here for a routine follow-up visit  BRCA2 gene mutation with planned bilateral mastectomy for breast cancer risk reduction BRCA2 mutation elevates breast cancer risk. Bilateral mastectomy scheduled for October 28th to mitigate risk. Post-mastectomy, routine imaging unnecessary. Ovarian cancer risk low; salpingo-oophorectomy can be delayed until age 21-45. Early oophorectomy induces menopause with side effects. [facility] trial investigates fallopian tube removal with delayed oophorectomy.  I will discuss with GYN oncology if she needs to be referred to them early on. - Proceed with bilateral mastectomy on October 28th. - Schedule annual follow-up post-mastectomy.   Visit Diagnosis 1. BRCA2 gene mutation positive      Dr. Annah Skene, MD, MPH [facility] [phone] 04/17/2024 12:53 PM

## 2024-04-17 NOTE — Progress Notes (Signed)
 Patient states she's doing okay & surgery is scheduled for 04/25/2024. No new or acute concerns at this time.

## 2024-04-18 ENCOUNTER — Other Ambulatory Visit: Payer: Self-pay

## 2024-04-18 ENCOUNTER — Encounter (HOSPITAL_BASED_OUTPATIENT_CLINIC_OR_DEPARTMENT_OTHER): Payer: Self-pay | Admitting: General Surgery

## 2024-04-23 NOTE — H&P (Signed)
 33 year old female who is mom has been treated for breast cancer was found to have a BRCA2 mutation. She was tested in August 2024 and found to have a BRCA2 mutation that is pathogenic. She has no prior history. She has no complaints referable to either breast. She is up-to-date on mammograms and MRI. These are negative. She has D density breast tissue. We discussed options before and she is scheduled for bilateral mastectomies with no reconstruction. No changes in interim.   Review of Systems: A complete review of systems was obtained from the patient. I have reviewed this information and discussed as appropriate with the patient. See HPI as well for other ROS.  Review of Systems  All other systems reviewed and are negative.   Medical History: History reviewed. No pertinent past medical history.  Patient Active Problem List  Diagnosis  BRCA2 gene mutation positive   Past Surgical History:  Procedure Laterality Date  CESAREAN DELIVERY Bilateral 11/02/2018  Procedure: CESAREAN DELIVERY ONLY; INCLUDING POSTPARTUM CARE; Surgeon: Grotegut, Chad Aaron, MD; Location: [facility]; Service: Gynecology; Laterality: Bilateral;  CESAREAN SECTION  TONSILLECTOMY & ADENOIDECTOMY    Allergies  Allergen Reactions  Adhesive Rash  Bacitracin-Polymyxin B Other (See Comments)  Contact redness after using Neosporin for several days   Current Outpatient Medications on File Prior to Visit  Medication Sig Dispense Refill  neomycin-bacitracin-polymyxin (NEOSPORIN) ointment Apply topically 4 (four) times daily (Patient not taking: Reported on 11/28/2018 ) 28 g 0  norethindrone (MICRONOR) 0.35 mg tablet Take 1 tablet (0.35 mg total) by mouth once daily 3 Package 3  pediatric multivit-iron-min (FLINTSTONES COMPLETE) Chew Take by mouth  psyllium (METAMUCIL) packet Take 1 packet by mouth once daily (Patient not taking: Reported on 11/28/2018 ) 60 packet 0  pyridoxine, vitamin B6, (VITAMIN B-6) 50 MG tablet Take  1 tablet (50 mg total) by mouth once daily (Patient not taking: Reported on 09/01/2018 ) 45 tablet 1  sertraline (ZOLOFT) 50 MG tablet Take 1 tablet (50 mg total) by mouth once daily 30 tablet 11   Family History  Problem Relation Age of Onset  Breast cancer Mother  Testicular cancer Father  Breast cancer Paternal Grandmother    Social History   Tobacco Use  Smoking Status Never  Smokeless Tobacco Never  Marital status: Married  Spouse name: Sam  Tobacco Use  Smoking status: Never  Smokeless tobacco: Never  Vaping Use  Vaping status: Never Used  Substance and Sexual Activity  Alcohol use: Not Currently  Drug use: Never  Sexual activity: Yes  Partners: Male   Objective:   Vitals:  03/30/24 0847   Physical Exam Vitals reviewed.  Constitutional:  Appearance: Normal appearance.  Chest:  Breasts: Right: No inverted nipple, mass or nipple discharge.  Left: No inverted nipple, mass or nipple discharge.  Lymphadenopathy:  Upper Body:  Right upper body: No axillary adenopathy.  Left upper body: No axillary adenopathy.  Neurological:  Mental Status: She is alert.   Assessment and Plan:   Diagnoses and all orders for this visit:  BRCA2 gene mutation positive   Bilateral risk reducing mastectomies  We discussed risk-reducing mastectomies again today. We discussed specifically that they are not 100% preventative. She still does not want to proceed with reconstruction of the surgeries that would have to go along with that. She would like to proceed with bilateral risk-reducing mastectomies and a flat closure. We discussed the risks of this to include bleeding, infection, hematoma, wound healing issues as well as possible  further surgery if she desires and this is not as flat as she would want. I think she should be fine given her body habitus however. We discussed that she will not need to be followed with imaging moving forward but this would be just with an exam. She is  scheduled for the end of the month and she will call back with any questions.

## 2024-04-25 ENCOUNTER — Encounter (HOSPITAL_BASED_OUTPATIENT_CLINIC_OR_DEPARTMENT_OTHER)
Admission: RE | Disposition: A | Discharge status: Home / Self Care | Payer: Self-pay | Source: Home / Self Care | Attending: General Surgery

## 2024-04-25 ENCOUNTER — Ambulatory Visit (HOSPITAL_BASED_OUTPATIENT_CLINIC_OR_DEPARTMENT_OTHER): Admitting: Anesthesiology

## 2024-04-25 ENCOUNTER — Observation Stay (HOSPITAL_BASED_OUTPATIENT_CLINIC_OR_DEPARTMENT_OTHER)
Admission: RE | Admit: 2024-04-25 | Discharge: 2024-04-26 | Disposition: A | Discharge status: Home / Self Care | Attending: General Surgery | Admitting: General Surgery

## 2024-04-25 ENCOUNTER — Encounter (HOSPITAL_BASED_OUTPATIENT_CLINIC_OR_DEPARTMENT_OTHER): Payer: Self-pay | Admitting: General Surgery

## 2024-04-25 DIAGNOSIS — Z01818 Encounter for other preprocedural examination: Principal | ICD-10-CM

## 2024-04-25 DIAGNOSIS — N6012 Diffuse cystic mastopathy of left breast: Secondary | ICD-10-CM | POA: Diagnosis not present

## 2024-04-25 DIAGNOSIS — D241 Benign neoplasm of right breast: Secondary | ICD-10-CM | POA: Insufficient documentation

## 2024-04-25 DIAGNOSIS — Z4001 Encounter for prophylactic removal of breast: Secondary | ICD-10-CM | POA: Diagnosis not present

## 2024-04-25 DIAGNOSIS — Z91040 Latex allergy status: Secondary | ICD-10-CM | POA: Insufficient documentation

## 2024-04-25 DIAGNOSIS — Z1501 Genetic susceptibility to malignant neoplasm of breast: Secondary | ICD-10-CM | POA: Diagnosis present

## 2024-04-25 DIAGNOSIS — Z9013 Acquired absence of bilateral breasts and nipples: Secondary | ICD-10-CM

## 2024-04-25 DIAGNOSIS — N6011 Diffuse cystic mastopathy of right breast: Secondary | ICD-10-CM | POA: Diagnosis not present

## 2024-04-25 HISTORY — DX: Genetic susceptibility to malignant neoplasm of urinary tract: Z15.07

## 2024-04-25 HISTORY — DX: Genetic susceptibility to malignant neoplasm of breast: Z15.01

## 2024-04-25 HISTORY — PX: TOTAL MASTECTOMY: SHX6129

## 2024-04-25 LAB — POCT PREGNANCY, URINE: Preg Test, Ur: NEGATIVE

## 2024-04-25 SURGERY — MASTECTOMY, SIMPLE
Anesthesia: General | Site: Breast | Laterality: Bilateral

## 2024-04-25 MED ORDER — MIDAZOLAM HCL 2 MG/2ML IJ SOLN
INTRAMUSCULAR | Status: AC
  Filled 2024-04-25: qty 2

## 2024-04-25 MED ORDER — SIMETHICONE 80 MG PO CHEW: 40.0000 mg | CHEWABLE_TABLET | Freq: Four times a day (QID) | ORAL | Status: DC | PRN

## 2024-04-25 MED ORDER — FENTANYL CITRATE (PF) 100 MCG/2ML IJ SOLN
INTRAMUSCULAR | Status: DC | PRN
  Administered 2024-04-25: 50 ug via INTRAVENOUS
  Administered 2024-04-25: 25 ug via INTRAVENOUS
  Administered 2024-04-25 (×2): 50 ug via INTRAVENOUS

## 2024-04-25 MED ORDER — TRANEXAMIC ACID 1000 MG/10ML IV SOLN
INTRAVENOUS | Status: AC
Start: 2024-04-25 — End: 2024-04-25
  Filled 2024-04-25: qty 30

## 2024-04-25 MED ORDER — FENTANYL CITRATE (PF) 100 MCG/2ML IJ SOLN
INTRAMUSCULAR | Status: AC
  Filled 2024-04-25: qty 2

## 2024-04-25 MED ORDER — ONDANSETRON 4 MG PO TBDP: 4.0000 mg | ORAL_TABLET | Freq: Four times a day (QID) | ORAL | Status: DC | PRN

## 2024-04-25 MED ORDER — 0.9 % SODIUM CHLORIDE (POUR BTL) OPTIME
TOPICAL | Status: DC | PRN
  Administered 2024-04-25: 1000 mL

## 2024-04-25 MED ORDER — PROPOFOL 500 MG/50ML IV EMUL
INTRAVENOUS | Status: AC
  Filled 2024-04-25: qty 50

## 2024-04-25 MED ORDER — CEFAZOLIN SODIUM-DEXTROSE 2-4 GM/100ML-% IV SOLN
INTRAVENOUS | Status: AC
  Filled 2024-04-25: qty 100

## 2024-04-25 MED ORDER — LIDOCAINE HCL (CARDIAC) PF 100 MG/5ML IV SOSY
PREFILLED_SYRINGE | INTRAVENOUS | Status: DC | PRN
  Administered 2024-04-25: 40 mg via INTRAVENOUS

## 2024-04-25 MED ORDER — LIDOCAINE 2% (20 MG/ML) 5 ML SYRINGE
INTRAMUSCULAR | Status: AC
  Filled 2024-04-25: qty 5

## 2024-04-25 MED ORDER — SODIUM CHLORIDE 0.9 % IV SOLN: INTRAVENOUS | Status: DC

## 2024-04-25 MED ORDER — ROCURONIUM BROMIDE 100 MG/10ML IV SOLN
INTRAVENOUS | Status: DC | PRN
  Administered 2024-04-25: 50 mg via INTRAVENOUS
  Administered 2024-04-25: 20 mg via INTRAVENOUS

## 2024-04-25 MED ORDER — AMISULPRIDE (ANTIEMETIC) 5 MG/2ML IV SOLN
INTRAVENOUS | Status: AC
  Filled 2024-04-25: qty 4

## 2024-04-25 MED ORDER — HEMOSTATIC AGENTS (NO CHARGE) OPTIME
TOPICAL | Status: DC | PRN
  Administered 2024-04-25 (×2): 1 via TOPICAL

## 2024-04-25 MED ORDER — ROCURONIUM BROMIDE 10 MG/ML (PF) SYRINGE
PREFILLED_SYRINGE | INTRAVENOUS | Status: AC
Start: 2024-04-25 — End: 2024-04-25
  Filled 2024-04-25: qty 10

## 2024-04-25 MED ORDER — METHOCARBAMOL 1000 MG/10ML IJ SOLN: 500.0000 mg | Freq: Three times a day (TID) | INTRAMUSCULAR | Status: DC | PRN

## 2024-04-25 MED ORDER — OXYCODONE HCL 5 MG PO TABS: 5.0000 mg | ORAL_TABLET | Freq: Once | ORAL | Status: DC | PRN

## 2024-04-25 MED ORDER — TRANEXAMIC ACID 1000 MG/10ML IV SOLN
INTRAVENOUS | Status: AC
  Filled 2024-04-25: qty 30

## 2024-04-25 MED ORDER — FENTANYL CITRATE (PF) 100 MCG/2ML IJ SOLN: 25.0000 ug | INTRAMUSCULAR | Status: DC | PRN

## 2024-04-25 MED ORDER — ACETAMINOPHEN 500 MG PO TABS
1000.0000 mg | ORAL_TABLET | ORAL | Status: AC
  Administered 2024-04-25: 1000 mg via ORAL

## 2024-04-25 MED ORDER — CHLORHEXIDINE GLUCONATE CLOTH 2 % EX PADS: 6.0000 | MEDICATED_PAD | Freq: Once | CUTANEOUS | Status: DC

## 2024-04-25 MED ORDER — TRANEXAMIC ACID 1000 MG/10ML IV SOLN
Status: DC | PRN
  Administered 2024-04-25: 3000 mg via TOPICAL

## 2024-04-25 MED ORDER — ACETAMINOPHEN 10 MG/ML IV SOLN
INTRAVENOUS | Status: AC
  Filled 2024-04-25: qty 100

## 2024-04-25 MED ORDER — AMISULPRIDE (ANTIEMETIC) 5 MG/2ML IV SOLN
10.0000 mg | Freq: Once | INTRAVENOUS | Status: AC | PRN
  Administered 2024-04-25: 10 mg via INTRAVENOUS

## 2024-04-25 MED ORDER — METHYLENE BLUE 20 MG/2ML IV SOSY
PREFILLED_SYRINGE | INTRAVENOUS | Status: AC
Start: 2024-04-25 — End: 2024-04-25
  Filled 2024-04-25: qty 2

## 2024-04-25 MED ORDER — MIDAZOLAM HCL (PF) 2 MG/2ML IJ SOLN
2.0000 mg | Freq: Once | INTRAMUSCULAR | Status: AC
  Administered 2024-04-25: 2 mg via INTRAVENOUS

## 2024-04-25 MED ORDER — ONDANSETRON HCL 4 MG/2ML IJ SOLN
INTRAMUSCULAR | Status: AC
  Filled 2024-04-25: qty 2

## 2024-04-25 MED ORDER — ACETAMINOPHEN 160 MG/5ML PO SOLN
650.0000 mg | Freq: Four times a day (QID) | ORAL | Status: DC
  Administered 2024-04-25 – 2024-04-26 (×3): 650 mg via ORAL
  Filled 2024-04-25 (×3): qty 20.3

## 2024-04-25 MED ORDER — KETOROLAC TROMETHAMINE 15 MG/ML IJ SOLN
15.0000 mg | Freq: Once | INTRAMUSCULAR | Status: AC
  Administered 2024-04-25: 15 mg via INTRAVENOUS
  Filled 2024-04-25: qty 1

## 2024-04-25 MED ORDER — PROPOFOL 10 MG/ML IV BOLUS
INTRAVENOUS | Status: DC | PRN
Start: 2024-04-25 — End: 2024-04-25
  Administered 2024-04-25: 200 mg via INTRAVENOUS

## 2024-04-25 MED ORDER — ONDANSETRON HCL 4 MG/2ML IJ SOLN
INTRAMUSCULAR | Status: DC | PRN
  Administered 2024-04-25: 4 mg via INTRAVENOUS

## 2024-04-25 MED ORDER — OXYCODONE HCL 5 MG/5ML PO SOLN: 5.0000 mg | Freq: Once | ORAL | Status: DC | PRN

## 2024-04-25 MED ORDER — OXYCODONE HCL 5 MG/5ML PO SOLN
5.0000 mg | ORAL | Status: DC | PRN
  Administered 2024-04-25 – 2024-04-26 (×4): 5 mg via ORAL
  Filled 2024-04-25 (×4): qty 5

## 2024-04-25 MED ORDER — METHOCARBAMOL 500 MG PO TABS
500.0000 mg | ORAL_TABLET | Freq: Three times a day (TID) | ORAL | Status: DC | PRN
  Administered 2024-04-25 – 2024-04-26 (×2): 500 mg via ORAL
  Filled 2024-04-25 (×2): qty 1

## 2024-04-25 MED ORDER — FENTANYL CITRATE (PF) 100 MCG/2ML IJ SOLN
100.0000 ug | Freq: Once | INTRAMUSCULAR | Status: AC
  Administered 2024-04-25: 50 ug via INTRAVENOUS

## 2024-04-25 MED ORDER — CEFAZOLIN SODIUM-DEXTROSE 2-3 GM-%(50ML) IV SOLR: INTRAVENOUS | Status: DC | PRN

## 2024-04-25 MED ORDER — LACTATED RINGERS IV SOLN: INTRAVENOUS | Status: DC

## 2024-04-25 MED ORDER — DEXAMETHASONE SODIUM PHOSPHATE 4 MG/ML IJ SOLN
INTRAMUSCULAR | Status: DC | PRN
  Administered 2024-04-25: 5 mg via INTRAVENOUS

## 2024-04-25 MED ORDER — CEFAZOLIN SODIUM-DEXTROSE 2-4 GM/100ML-% IV SOLN
2.0000 g | INTRAVENOUS | Status: AC
  Administered 2024-04-25: 2 g via INTRAVENOUS

## 2024-04-25 MED ORDER — ONDANSETRON HCL 4 MG/2ML IJ SOLN: 4.0000 mg | Freq: Four times a day (QID) | INTRAMUSCULAR | Status: DC | PRN

## 2024-04-25 MED ORDER — SUGAMMADEX SODIUM 200 MG/2ML IV SOLN
INTRAVENOUS | Status: DC | PRN
Start: 2024-04-25 — End: 2024-04-25
  Administered 2024-04-25: 150 mg via INTRAVENOUS

## 2024-04-25 MED ORDER — ENSURE PRE-SURGERY PO LIQD: 296.0000 mL | Freq: Once | ORAL | Status: DC

## 2024-04-25 MED ORDER — PHENYLEPHRINE HCL (PRESSORS) 10 MG/ML IV SOLN
INTRAVENOUS | Status: DC | PRN
  Administered 2024-04-25 (×5): 80 ug via INTRAVENOUS

## 2024-04-25 MED ORDER — EPHEDRINE SULFATE (PRESSORS) 25 MG/5ML IV SOSY
PREFILLED_SYRINGE | INTRAVENOUS | Status: DC | PRN
  Administered 2024-04-25 (×2): 10 mg via INTRAVENOUS

## 2024-04-25 SURGICAL SUPPLY — 51 items
BENZOIN TINCTURE PRP APPL 2/3 (GAUZE/BANDAGES/DRESSINGS) IMPLANT
BINDER BREAST LRG (GAUZE/BANDAGES/DRESSINGS) IMPLANT
BINDER BREAST MEDIUM (GAUZE/BANDAGES/DRESSINGS) IMPLANT
BINDER BREAST XLRG (GAUZE/BANDAGES/DRESSINGS) IMPLANT
BINDER BREAST XXLRG (GAUZE/BANDAGES/DRESSINGS) IMPLANT
BIOPATCH RED 1 DISK 7.0 (GAUZE/BANDAGES/DRESSINGS) IMPLANT
BLADE HEX COATED 2.75 (ELECTRODE) ×2 IMPLANT
BLADE SURG 10 STRL SS (BLADE) ×2 IMPLANT
BLADE SURG 15 STRL LF DISP TIS (BLADE) ×2 IMPLANT
CANISTER SUCT 1200ML W/VALVE (MISCELLANEOUS) ×2 IMPLANT
CHLORAPREP W/TINT 26 (MISCELLANEOUS) ×2 IMPLANT
CLIP APPLIE 9.375 MED OPEN (MISCELLANEOUS) IMPLANT
COVER BACK TABLE 60X90IN (DRAPES) ×2 IMPLANT
COVER MAYO STAND STRL (DRAPES) ×2 IMPLANT
DERMABOND ADVANCED .7 DNX12 (GAUZE/BANDAGES/DRESSINGS) ×4 IMPLANT
DRAIN CHANNEL 19F RND (DRAIN) IMPLANT
DRAPE TOP ARMCOVERS (MISCELLANEOUS) ×2 IMPLANT
DRAPE UTILITY XL STRL (DRAPES) ×2 IMPLANT
DRSG TEGADERM 4X10 (GAUZE/BANDAGES/DRESSINGS) IMPLANT
DRSG TEGADERM 4X4.75 (GAUZE/BANDAGES/DRESSINGS) IMPLANT
ELECTRODE REM PT RTRN 9FT ADLT (ELECTROSURGICAL) ×2 IMPLANT
EVACUATOR SILICONE 100CC (DRAIN) ×2 IMPLANT
GAUZE PAD ABD 8X10 STRL (GAUZE/BANDAGES/DRESSINGS) ×2 IMPLANT
GAUZE SPONGE 4X4 12PLY STRL (GAUZE/BANDAGES/DRESSINGS) ×2 IMPLANT
GLOVE BIOGEL PI IND STRL 7.0 (GLOVE) IMPLANT
GLOVE BIOGEL PI IND STRL 7.5 (GLOVE) ×2 IMPLANT
GLOVE SURG SS PI 7.0 STRL IVOR (GLOVE) IMPLANT
GOWN STRL REUS W/ TWL LRG LVL3 (GOWN DISPOSABLE) ×6 IMPLANT
KIT FILL ASEPTIC TRANSFER (MISCELLANEOUS) ×2 IMPLANT
NS IRRIG 1000ML POUR BTL (IV SOLUTION) ×2 IMPLANT
PACK BASIN DAY SURGERY FS (CUSTOM PROCEDURE TRAY) ×2 IMPLANT
PENCIL SMOKE EVACUATOR (MISCELLANEOUS) ×2 IMPLANT
POWDER SURGICEL 3.0 GRAM (HEMOSTASIS) IMPLANT
SLEEVE SCD COMPRESS KNEE MED (STOCKING) ×2 IMPLANT
SPIKE FLUID TRANSFER (MISCELLANEOUS) IMPLANT
SPONGE T-LAP 18X18 ~~LOC~~+RFID (SPONGE) ×4 IMPLANT
STAPLER SKIN PROX WIDE 3.9 (STAPLE) IMPLANT
STRIP CLOSURE SKIN 1/2X4 (GAUZE/BANDAGES/DRESSINGS) IMPLANT
SUT ETHILON 2 0 FS 18 (SUTURE) IMPLANT
SUT MNCRL AB 4-0 PS2 18 (SUTURE) ×2 IMPLANT
SUT SILK 2 0 SH (SUTURE) IMPLANT
SUT VIC AB 3-0 SH 18 (SUTURE) IMPLANT
SUT VIC AB 3-0 SH 27X BRD (SUTURE) IMPLANT
SUT VIC AB 3-0 SH 8-18 (SUTURE) IMPLANT
SUT VIC AB 4-0 PS2 18 (SUTURE) IMPLANT
SUT VICRYL 3-0 CR8 SH (SUTURE) IMPLANT
SYR BULB IRRIG 60ML STRL (SYRINGE) ×4 IMPLANT
TOWEL GREEN STERILE FF (TOWEL DISPOSABLE) ×4 IMPLANT
TUBE CONNECTING 20X1/4 (TUBING) ×2 IMPLANT
UNDERPAD 30X36 HEAVY ABSORB (UNDERPADS AND DIAPERS) ×2 IMPLANT
YANKAUER SUCT BULB TIP NO VENT (SUCTIONS) ×2 IMPLANT

## 2024-04-25 NOTE — Progress Notes (Signed)
 Assisted Dr. Casilda Carls with left, right, pectoralis, ultrasound guided block. Side rails up, monitors on throughout procedure. See vital signs in flow sheet. Tolerated Procedure well.

## 2024-04-25 NOTE — Anesthesia Procedure Notes (Signed)
 Procedure Name: Intubation Date/Time: 04/25/2024 7:51 AM  Performed by: Pam Macario BROCKS, CRNAPre-anesthesia Checklist: Patient identified, Emergency Drugs available, Suction available, Patient being monitored and Timeout performed Patient Re-evaluated:Patient Re-evaluated prior to induction Oxygen Delivery Method: Circle system utilized Preoxygenation: Pre-oxygenation with 100% oxygen Induction Type: IV induction Ventilation: Mask ventilation without difficulty Laryngoscope Size: Mac and 3 Grade View: Grade II Tube type: Oral Tube size: 7.0 mm Number of attempts: 1 Airway Equipment and Method: Stylet and Oral airway Placement Confirmation: ETT inserted through vocal cords under direct vision, positive ETCO2, breath sounds checked- equal and bilateral and CO2 detector Secured at: 23 cm Tube secured with: Tape Dental Injury: Teeth and Oropharynx as per pre-operative assessment

## 2024-04-25 NOTE — Anesthesia Postprocedure Evaluation (Signed)
 Anesthesia Post Note  Patient: Brianna Steele  Procedure(s) Performed: BILATERAL RISK REDUCING MASTECTOMIES (Bilateral: Breast)     Patient location during evaluation: PACU Anesthesia Type: General Level of consciousness: awake and alert Pain management: pain level controlled Vital Signs Assessment: post-procedure vital signs reviewed and stable Respiratory status: spontaneous breathing, nonlabored ventilation, respiratory function stable and patient connected to nasal cannula oxygen Cardiovascular status: blood pressure returned to baseline and stable Postop Assessment: no apparent nausea or vomiting Anesthetic complications: no   No notable events documented.  Last Vitals:  Vitals:   04/25/24 1033 04/25/24 1126  BP: 118/62 115/68  Pulse: (!) 110 96  Resp: 19 20  Temp:  36.6 C  SpO2: 99% 99%    Last Pain:  Vitals:   04/25/24 1252  TempSrc:   PainSc: 5                  Epifanio Lamar BRAVO

## 2024-04-25 NOTE — Progress Notes (Signed)
 Patient informed on previous conversation with Dr ebbie. Patient wants to hold off on muscle relaxer at this time since she can have prn pain medication soon, refer to mar for prn pain medication time,route and dosage. Will continue to assess throughout rest of shift.

## 2024-04-25 NOTE — Transfer of Care (Signed)
 Immediate Anesthesia Transfer of Care Note  Patient: Brianna Steele  Procedure(s) Performed: BILATERAL RISK REDUCING MASTECTOMIES (Bilateral: Breast)  Patient Location: PACU  Anesthesia Type:General  Level of Consciousness: awake and alert   Airway & Oxygen Therapy: Patient Spontanous Breathing  Post-op Assessment: Report given to RN and Post -op Vital signs reviewed and stable  Post vital signs: Reviewed and stable  Last Vitals:  Vitals Value Taken Time  BP 123/73 04/25/24 10:00  Temp    Pulse 104 04/25/24 10:02  Resp 14 04/25/24 10:02  SpO2 99 % 04/25/24 10:02  Vitals shown include unfiled device data.  Last Pain:  Vitals:   04/25/24 0631  TempSrc: Temporal  PainSc: 0-No pain         Complications: No notable events documented.

## 2024-04-25 NOTE — Op Note (Signed)
 Preoperative diagnosis: BRCA2 mutation Postoperative diagnosis: Same as above Procedure: Bilateral risk-reducing mastectomies with flap closure Surgeon: Dr. Adina Bury Anesthesia: General With bilateral pectoral blocks Estimated blood loss: 100 cc Specimens: 1.  Right breast marked short stitch superior, long stitch lateral 2.  Left breast marked short superior long lateral Drains: 19 French Blake drain to either side Complications: Sponge count was correct at completion  Indications: This is a 33 year old female who has a long treated for breast cancer she was found to have a BRCA2 mutation that was pathogenic.  She is up-to-date on her mammograms and MRI and both of these are negative.  She has D density tissue.  She desired to undergo surgical risk reduction.  She did not want to proceed with reconstruction at the same time.  Procedure: After informed consent was obtained she first underwent bilateral pectoral blocks.  She was given antibiotics.  SCDs were placed.  She was placed under general anesthesia without complication.  I then marked her prior to beginning.  She was then prepped and draped in the standard sterile surgical fashion.  Surgical timeout was then performed.  I did the right side first.  I made a large elliptical incision to encompass the nipple and areola using the inframammary fold as the inferior lid.  I then created flaps of the clavicle sternum inframammary fold and the latissimus laterally.  I removed a lot of the excess fat in the area of the latissimus as well as the upper outer quadrant.  For about 5 cm.  I then removed the breast and the pectoralis fascia from the muscle.  I obtained hemostasis.  I placed a TXA soaked sponge for that and then removed that.  I placed a 52 French Blake drain.  I then closed this with a 3-0 Vicryl's and 4-0 Monocryl.  I placed 2-0 nylon vertical mattress suture in the center of this.  Eventually I placed Steri-Strips over this.  I  then did the left side.  I performed a left mastectomy in a similar fashion to the right.  I made a large elliptical incision to encompass the nipple and areola using the inframammary fold as the inferior lid.  I then created flaps of the clavicle sternum inframammary fold and the latissimus laterally.  I removed a lot of the excess fat in the area of the latissimus as well as the upper outer quadrant.  For about 5 cm.  I then removed the breast and the pectoralis fascia from the muscle.  I obtained hemostasis.  I placed a TXA soaked sponge for that and then removed that.  I placed a 74 French Blake drain.  I then closed this with a 3-0 Vicryl's and 4-0 Monocryl.  I placed 2-0 nylon vertical mattress suture in the center of this.  Eventually I placed Steri-Strips over this. She was then awakened from anesthesia and transferred recovery in stable condition.

## 2024-04-25 NOTE — Anesthesia Preprocedure Evaluation (Signed)
 Anesthesia Evaluation  Patient identified by MRN, date of birth, ID band Patient awake    Reviewed: Allergy & Precautions, NPO status , Patient's Chart, lab work & pertinent test results  Airway Mallampati: II  TM Distance: >3 FB Neck ROM: Full    Dental  (+) Dental Advisory Given   Pulmonary neg pulmonary ROS   breath sounds clear to auscultation       Cardiovascular negative cardio ROS  Rhythm:Regular Rate:Normal     Neuro/Psych negative neurological ROS     GI/Hepatic negative GI ROS, Neg liver ROS,,,  Endo/Other  negative endocrine ROS    Renal/GU negative Renal ROS     Musculoskeletal   Abdominal   Peds  Hematology negative hematology ROS (+)   Anesthesia Other Findings   Reproductive/Obstetrics                              Anesthesia Physical Anesthesia Plan  ASA: 1  Anesthesia Plan: General   Post-op Pain Management: Ofirmev  IV (intra-op)*, Toradol IV (intra-op)* and Regional block*   Induction: Intravenous  PONV Risk Score and Plan: 3 and Ondansetron , Dexamethasone, Midazolam and Treatment may vary due to age or medical condition  Airway Management Planned: Oral ETT  Additional Equipment:   Intra-op Plan:   Post-operative Plan: Extubation in OR  Informed Consent: I have reviewed the patients History and Physical, chart, labs and discussed the procedure including the risks, benefits and alternatives for the proposed anesthesia with the patient or authorized representative who has indicated his/her understanding and acceptance.     Dental advisory given  Plan Discussed with: CRNA  Anesthesia Plan Comments:         Anesthesia Quick Evaluation

## 2024-04-25 NOTE — Progress Notes (Signed)
 Patient complained of right pain to axillary 6/10 slight swelling noted to the area still soft no issues to the JP drain. Called Dr gelene office to be sent to the triage nurse to page Dr ebbie. Dr ebbie called this nurse back and stated that was normal for her to be sore to the axillary and to give muscle relaxer.

## 2024-04-25 NOTE — Discharge Instructions (Signed)
 [facility] [phone]  MASTECTOMY: POST OP INSTRUCTIONS Take 400 mg of ibuprofen every 8 hours or 650 mg tylenol  every 6 hours for next 72 hours then as needed. Use ice several times daily also.   A prescription for pain medication may be given to you upon discharge.  Take your pain medication as prescribed, if needed.  If narcotic pain medicine is not needed, then you may take acetaminophen  (Tylenol ), naprosyn (Alleve) or ibuprofen (Advil) as needed. Take your usually prescribed medications unless otherwise directed. If you need a refill on your pain medication, please contact your pharmacy.  They will contact our office to request authorization.  Prescriptions will not be filled after 5pm or on week-ends. You should follow a light diet the first 24 hours after surgery.  Resume your normal diet the day after surgery. Most patients will experience some swelling and bruising on the chest and underarm.  Ice packs will help.  Swelling and bruising can take several days to resolve. Wear the binder or surgical bra for 72 hours day and night. After that please wear during the day when up and around only. You do not have to wear at night unless you want to.  It is common to experience some constipation if taking pain medication after surgery.  Increasing fluid intake and taking a stool softener (such as Colace) will usually help or prevent this problem from occurring.  A mild laxative (Milk of Magnesia or Miralax ) should be taken according to package instructions if there are no bowel movements after 48 hours. There is glue and steristrips on your incision. They will come off in the next few weeks.  You may take a shower 48 hours after surgery.  Any sutures will be removed at an office visit DRAINS:  If you have drains in place, it is important to keep a list of the amount of drainage produced each day in your drains.  Before leaving the hospital, you should be instructed on drain care.  Call  our office if you have any questions about your drains. I will remove your drains when they put out less than 30 cc or ml for 2 consecutive days. We will communicate with you about drain output and have you come in when ready to be removed.  ACTIVITIES:  You may resume regular (light) daily activities beginning the next day--such as daily self-care, walking, climbing stairs--gradually increasing activities as tolerated.  You may have sexual intercourse when it is comfortable.  Refrain from any heavy lifting or straining until approved by your doctor. You may drive when you are no longer taking prescription pain medication, you can comfortably wear a seatbelt, and you can safely maneuver your car and apply brakes. RETURN TO WORK:  __________________________________________________________ Brianna Steele should see your doctor in the office for a follow-up appointment approximately 2-3 after your surgery.  Your doctor's nurse will typically make your follow-up appointment when she calls you with your pathology report.  Expect your pathology report 3-4 business days after surgery. WHEN TO CALL YOUR DR Ashrita Chrismer: Fever over 101.0 Nausea and/or vomiting Extreme swelling or bruising Continued bleeding from incision. Increased pain, redness, or drainage from the incision. The clinic staff is available to answer your questions during regular business hours.  Please don't hesitate to call and ask to speak to one of the nurses for clinical concerns.  If you have a medical emergency, go to the nearest emergency room or call 911.  A surgeon from [facility] is  always on call at the hospital.  [address], [address], [place], [place]  [address] ? [address] [address], [place], [place]   [address] [phone] ? [phone] ? FAX [phone] Web site: [email or web address]   About my Jackson-Pratt Bulb Drain  What is a Jackson-Pratt bulb? A Jackson-Pratt is a soft, round device used to collect  drainage. It is connected to a long, thin drainage catheter, which is held in place by one or two small stiches near your surgical incision site. When the bulb is squeezed, it forms a vacuum, forcing the drainage to empty into the bulb.  Emptying the Jackson-Pratt bulb- To empty the bulb: 1. Release the plug on the top of the bulb. 2. Pour the bulb's contents into a measuring container which your nurse will provide. 3. Record the time emptied and amount of drainage. Empty the drain(s) as often as your     doctor or nurse recommends.  Date                  Time                    Amount (Drain 1)                 Amount (Drain 2)  _____________________________________________________________________  _____________________________________________________________________  _____________________________________________________________________  _____________________________________________________________________  _____________________________________________________________________  _____________________________________________________________________  _____________________________________________________________________  _____________________________________________________________________  Squeezing the Jackson-Pratt Bulb- To squeeze the bulb: 1. Make sure the plug at the top of the bulb is open. 2. Squeeze the bulb tightly in your fist. You will hear air squeezing from the bulb. 3. Replace the plug while the bulb is squeezed. 4. Use a safety pin to attach the bulb to your clothing. This will keep the catheter from     pulling at the bulb insertion site.  When to call your doctor- Call your doctor if: Drain site becomes red, swollen or hot. You have a fever greater than 101 degrees F. There is oozing at the drain site. Drain falls out (apply a guaze bandage over the drain hole and secure it with tape). Drainage increases daily not related to activity patterns. (You will usually  have more drainage when you are active than when you are resting.) Drainage has a bad odor.  Next dose of tylenol  will be at 11;30 Next dose of oxycodone will be at 11:19 Next dose of muscle relaxer will be at 1:20

## 2024-04-25 NOTE — Interval H&P Note (Signed)
 History and Physical Interval Note:  04/25/2024 7:16 AM  Brianna Steele  has presented today for surgery, with the diagnosis of BRCA 2.  The various methods of treatment have been discussed with the patient and family. After consideration of risks, benefits and other options for treatment, the patient has consented to  Procedure(s) with comments: MASTECTOMY, SIMPLE (Bilateral) - BILATERAL RISK REDUCING MASTECTOMIES GEN w/PEC BLOCK as a surgical intervention.  The patient's history has been reviewed, patient examined, no change in status, stable for surgery.  I have reviewed the patient's chart and labs.  Questions were answered to the patient's satisfaction.     Donnice Bury

## 2024-04-26 ENCOUNTER — Encounter (HOSPITAL_BASED_OUTPATIENT_CLINIC_OR_DEPARTMENT_OTHER): Payer: Self-pay | Admitting: General Surgery

## 2024-04-26 DIAGNOSIS — Z1501 Genetic susceptibility to malignant neoplasm of breast: Secondary | ICD-10-CM | POA: Diagnosis not present

## 2024-04-26 MED ORDER — METHOCARBAMOL 500 MG PO TABS: 500.0000 mg | ORAL_TABLET | Freq: Three times a day (TID) | ORAL | 0 refills | Status: DC | PRN

## 2024-04-26 MED ORDER — OXYCODONE HCL 5 MG/5ML PO SOLN: 5.0000 mg | ORAL | 0 refills | Status: DC | PRN

## 2024-04-26 NOTE — Discharge Summary (Signed)
 Physician Discharge Summary  Patient ID: Brianna Steele MRN: [id number] DOB/AGE: Jul 31, 1990 32 y.o.  Admit date: 04/25/2024 Discharge date: 04/26/2024  Admission Diagnoses: Brca mutation Discharge Diagnoses:  Principal Problem:   S/P bilateral mastectomy   Discharged Condition: good  Hospital Course: 70 yof underwent bilateral mastectomies and is doing well following am, drains as expected and no hematoma. Pain controlled. Has some swelling above binder but will transition to bra  Consults: None  Significant Diagnostic Studies: none  Treatments: surgery: bilateral mastectomies  Discharge Exam: Blood pressure 105/61, pulse 80, temperature 99 F (37.2 C), resp. rate 20, height 5' 1 (1.549 m), weight 62.4 kg, last menstrual period 04/09/2024, SpO2 100%, unknown if currently breastfeeding. No hematoma, flaps viable, drains minimal output  Disposition: Discharge disposition: 01-Home or Self Care        Allergies as of 04/26/2024       Reactions   Neosporin [bacitracin-polymyxin B]    Contact redness after using Neosporin for several days   Latex Rash   Tape Rash        Medication List     STOP taking these medications    triamcinolone  cream 0.1 % Commonly known as: KENALOG        TAKE these medications    Flintstones Complete Chew Chew by mouth.   methocarbamol 500 MG tablet Commonly known as: ROBAXIN Take 1 tablet (500 mg total) by mouth every 8 (eight) hours as needed for muscle spasms.   oxyCODONE 5 MG/5ML solution Commonly known as: ROXICODONE Take 5 mLs (5 mg total) by mouth every 4 (four) hours as needed for severe pain (pain score 7-10) or moderate pain (pain score 4-6).        Follow-up Information     Brianna Cough, MD Follow up in 2 week(s).   Specialty: General Surgery Contact information: [address] [place] [address] [phone]                 Signed: Cough Brianna 04/26/2024, 8:06  AM

## 2024-04-28 LAB — SURGICAL PATHOLOGY

## 2024-04-28 NOTE — Telephone Encounter (Signed)
 Reason for Call Advice Only Patient called to say that the insurance needs more info to get the MRI approved.

## 2024-05-03 MED ORDER — BUPIVACAINE-EPINEPHRINE (PF) 0.25% -1:200000 IJ SOLN
INTRAMUSCULAR | Status: DC | PRN
  Administered 2024-04-25 (×2): 30 mL

## 2024-05-03 MED ORDER — CLONIDINE HCL (ANALGESIA) 100 MCG/ML EP SOLN
EPIDURAL | Status: DC | PRN
  Administered 2024-04-25 (×2): 50 ug

## 2024-05-03 NOTE — Addendum Note (Signed)
 Addendum  created 05/03/24 1013 by Epifanio Charleston, MD   Child order released for a procedure order, Clinical Note Signed, Intraprocedure Blocks edited, Intraprocedure Meds edited, SmartForm saved

## 2024-05-03 NOTE — Anesthesia Procedure Notes (Addendum)
    Anesthesia Regional Block: Pectoralis block   Pre-Anesthetic Checklist: , timeout performed,  Correct Patient, Correct Site, Correct Laterality,  Correct Procedure, Correct Position, site marked,  Risks and benefits discussed,  Surgical consent,  Pre-op evaluation,  At surgeon's request and post-op pain management  Laterality: Right and Left  Prep: chloraprep       Needles:  Injection technique: Single-shot  Needle Type: Echogenic Needle     Needle Length: 9cm  Needle Gauge: 21     Additional Needles:   Procedures:,,,, ultrasound used (permanent image in chart),,    Narrative:  Start time: 04/25/2024 6:55 AM End time: 04/25/2024 7:04 AM Injection made incrementally with aspirations every 5 mL.  Performed by: Personally  Anesthesiologist: Epifanio Charleston, MD

## 2024-05-31 NOTE — Therapy (Signed)
 OUTPATIENT PHYSICAL THERAPY  UPPER EXTREMITY ONCOLOGY EVALUATION  Patient Name: Brianna Steele MRN: [id number] DOB:[birth year], 33 y.o., female Today's Date: 06/01/2024  END OF SESSION:  PT End of Session - 06/01/24 0859     Visit Number 1    Number of Visits 12    Date for Recertification  07/13/24    PT Start Time 0902    PT Stop Time 0955    PT Time Calculation (min) 53 min    Activity Tolerance Patient tolerated treatment well    Behavior During Therapy [facility] for tasks assessed/performed          Past Medical History:  Diagnosis Date   BRCA2 gene mutation positive in female    Depression    Dermatofibroma of female breast 07/07/2019   Patient had a bump upper aspect of breast saw dermatology Dr. Junior shave biopsy shows dermatofibroma December 2020   Past Surgical History:  Procedure Laterality Date   CESAREAN SECTION     TONSILLECTOMY AND ADENOIDECTOMY  04/29/1998   TOTAL MASTECTOMY Bilateral 04/25/2024   Procedure: BILATERAL RISK REDUCING MASTECTOMIES;  Surgeon: Ebbie Cough, MD;  Location:  [facility];  Service: General;  Laterality: Bilateral;   Patient Active Problem List   Diagnosis Date Noted   S/P bilateral mastectomy 04/25/2024   Genetic testing 03/08/2023   BRCA2 gene mutation positive 03/08/2023   Family history of BRCA2 gene positive 02/24/2023   Sinusitis 11/20/2021   Dermatofibroma of female breast 07/07/2019    PCP:   REFERRING PROVIDER: Cough Ebbie COME  REFERRING DIAG: s/p Bilateral Risk Reducing Mastectomies  THERAPY DIAG:  BRCA2 positive  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 Pain is pretty low. More soreness, tightness, dscomfort, pressure. If eel some fluid under my right arm. I see Dr. Ebbie Jun 09, 2024. Did the exercises for the first time yesterday because prior to that she did them until she had a leak from her incision  PERTINENT HISTORY:  Pt  was seen for a [facility] cancer genetics consultation due to her mother's recent genetic testing which showed a BRCA2 mutation . She was also found to be positive for BRCA 2 positive gene mutation and underwent bilateral risk reducing mastectomies on 04/25/2024.  PAIN:  Are you  having pain? Yes NPRS scale: 0/10- 1/10 Pain location: bilateral shoulder ROM Pain orientation: Bilateral  PAIN TYPE: aching, sharp, and tight Pain description: intermittent  Aggravating factors: reaching, trying to sleep at times Relieving factors: resting it.  PRECAUTIONS: allergies to some adhesives,   RED FLAGS: None   WEIGHT BEARING RESTRICTIONS: No  FALLS:  Has patient fallen in last 6 months? No  LIVING ENVIRONMENT: Lives with: lives with their family, husband and twin girls Lives in: House/apartment    OCCUPATION: doesn't work outside the home  LEISURE: doing things with family, crafts  HAND DOMINANCE: right   PRIOR LEVEL OF FUNCTION: Independent  PATIENT GOALS:  Get ROM back and decrease tightness   OBJECTIVE: Note: Objective measures were completed at Evaluation unless otherwise noted.  COGNITION: Overall cognitive status: Within functional limits for tasks assessed   PALPATION: Tenderness bilateral UT, chest  OBSERVATIONS / OTHER ASSESSMENTS: Steri strips still present over incisions; large scab right breast incision, multiple small scabs present along incisions under streri strips,significant swelling right axillary region greater than left, Bilateral hands significantly dry skin  SENSATION: Light touch: Deficits     POSTURE: forward  head, rounded shoulders  UPPER EXTREMITY AROM/PROM:  A/PROM RIGHT   eval   Shoulder extension 34,  Shoulder flexion 119  Shoulder abduction 88  Shoulder internal rotation   Shoulder external rotation     (Blank rows = not tested)  A/PROM LEFT   eval  Shoulder extension 38  Shoulder flexion 125  Shoulder abduction 89  Shoulder internal rotation   Shoulder external rotation     (Blank rows = not tested)  CERVICAL AROM: All within normal limits:    UPPER EXTREMITY STRENGTH:   LYMPHEDEMA ASSESSMENTS:   SURGERY TYPE/DATE: 04/25/2024 Bilateral risk reducing mastectomies for BRCA II  NUMBER OF LYMPH NODES REMOVED: 0  CHEMOTHERAPY: NONE  RADIATION:none  HORMONE TREATMENT: NONE  INFECTIONS: NO       QUICK DASH SURVEY: 43%                                                                                                                            TREATMENT DATE:  06/01/2024 Pt given post op pillow to use under the right arm for axillary swelling. Half inch foam pads in TG soft made to place in bilateral axillary borders of bra to try and keep over area of swelling. Instructed pt in all post op exercises x 5 reps ea and explained importance of her doing atleast 2 times per day to improve motion. Showed flexion and stargazer in supine.    PATIENT EDUCATION:  Education details: Pt given post op pillow to use under the right arm for swelling. Half inch foam pads in TG soft made to place in bilateral axillary borders of bra to try and keep over area of swelling. Instructed pt in all post op exercises x 5 reps ea and explained importance of her doing atleast 2 times per day  to improve motion. Showed flexion and stargazer in supine. Person educated: Patient and Spouse Education method: Medical Illustrator, has handout from MD Education comprehension: verbalized understanding and returned demonstration  HOME EXERCISE PROGRAM: 4 post op exercises with flexion and  stargazer in supine  ASSESSMENT:  CLINICAL IMPRESSION: Patient is a 33 y.o. female who was seen today for physical therapy evaluation and treatment s/p Bilateral Risk Reducing Mastectomies on 04/25/2024. She presents with pain/ tightness/ and limitations in bilateral shoulder ROM and strength. She has significant right axillary swelling and mild left axillary swelling. Postural adaptations present s/p surgery. She was instructed in, and performed all post op exercises and the importance of doing 2-3 times per day was emphasized. Her incisions are still covered with steri stripss with some scabbing still present. She will benefit from exercise, manual soft tissue mobilization, MLD, and progression to strength.She will benefit from skilled therapy to address deficits noted and return pt to prior level of function   OBJECTIVE IMPAIRMENTS: decreased activity tolerance, decreased knowledge of condition, decreased mobility, decreased ROM, decreased strength, increased edema, impaired sensation, impaired UE functional use, postural dysfunction, and pain.   ACTIVITY LIMITATIONS: carrying, lifting, sleeping, bed mobility, reach over head, hygiene/grooming, caring for others, and driving  PARTICIPATION LIMITATIONS: cleaning, laundry, driving, community activity, and yard work  PERSONAL FACTORS: Bilateral Mastectomy for BRCA2+ are also affecting patient's functional outcome.   REHAB POTENTIAL: Excellent  CLINICAL DECISION MAKING: Stable/uncomplicated  EVALUATION COMPLEXITY: Low  GOALS: Goals reviewed with patient? Yes  SHORT TERM GOALS=LONG TERM GOALS: Target date: 07/14/2023  Pt will be independent with HEP for shoulder ROM and strengthening Baseline: Goal status: INITIAL  2.  Pt will improve bilateral shoulder ROM to atleast 140 for flexion and 160 for abd Baseline:  Goal status: INITIAL  3.  Pt will note decreased swelling at right and left axillary regions by atleast 50% Baseline:  Goal  status: INITIAL  4.  Pt will be independent with self MLD to decrease swelling Baseline:  Goal status: INITIAL  5.  Pt will be tolerant of light strengthening Baseline:  Goal status: INITIAL  6.  Quick dash will be no greater than 15% Baseline:  Goal status: INITIAL  PLAN:  PT FREQUENCY: 2x/week, may decrease to 1x if doing well  PT DURATION: 6 weeks  PLANNED INTERVENTIONS: 97164- PT Re-evaluation, 97750- Physical Performance Testing, 97110-Therapeutic exercises, 97530- Therapeutic activity, 97112- Neuromuscular re-education, 97535- Self Care, 02859- Manual therapy, 20560 (1-2 muscles), 20561 (3+ muscles)- Dry Needling, Therapeutic exercises, Therapeutic activity, Neuromuscular re-education, Gait training, and Self Care  PLAN FOR NEXT SESSION: how are exs, foam pads help?, pulleys,supine wand, STM, PROM, MLD, check incisions  Grayce JINNY Sheldon, PT 06/01/2024, 9:56 AM

## 2024-06-01 ENCOUNTER — Other Ambulatory Visit: Payer: Self-pay

## 2024-06-01 ENCOUNTER — Ambulatory Visit: Attending: General Surgery

## 2024-06-01 DIAGNOSIS — Z1509 Genetic susceptibility to other malignant neoplasm: Secondary | ICD-10-CM | POA: Insufficient documentation

## 2024-06-01 DIAGNOSIS — M25611 Stiffness of right shoulder, not elsewhere classified: Secondary | ICD-10-CM | POA: Diagnosis present

## 2024-06-01 DIAGNOSIS — Z1507 Genetic susceptibility to malignant neoplasm of urinary tract: Secondary | ICD-10-CM | POA: Insufficient documentation

## 2024-06-01 DIAGNOSIS — M25612 Stiffness of left shoulder, not elsewhere classified: Secondary | ICD-10-CM | POA: Diagnosis present

## 2024-06-01 DIAGNOSIS — R600 Localized edema: Secondary | ICD-10-CM | POA: Diagnosis present

## 2024-06-01 DIAGNOSIS — Z1501 Genetic susceptibility to malignant neoplasm of breast: Secondary | ICD-10-CM | POA: Diagnosis present

## 2024-06-01 DIAGNOSIS — Z15068 Genetic susceptibility to other malignant neoplasm of digestive system: Secondary | ICD-10-CM | POA: Insufficient documentation

## 2024-06-01 DIAGNOSIS — Z1589 Genetic susceptibility to other disease: Secondary | ICD-10-CM | POA: Insufficient documentation

## 2024-06-06 ENCOUNTER — Ambulatory Visit: Admitting: Rehabilitation

## 2024-06-07 ENCOUNTER — Ambulatory Visit: Admitting: Rehabilitation

## 2024-06-08 ENCOUNTER — Ambulatory Visit

## 2024-06-12 ENCOUNTER — Ambulatory Visit

## 2024-06-12 DIAGNOSIS — M25612 Stiffness of left shoulder, not elsewhere classified: Secondary | ICD-10-CM

## 2024-06-12 DIAGNOSIS — Z1501 Genetic susceptibility to malignant neoplasm of breast: Secondary | ICD-10-CM | POA: Diagnosis not present

## 2024-06-12 DIAGNOSIS — R600 Localized edema: Secondary | ICD-10-CM

## 2024-06-12 DIAGNOSIS — M25611 Stiffness of right shoulder, not elsewhere classified: Secondary | ICD-10-CM

## 2024-06-12 DIAGNOSIS — Z1509 Genetic susceptibility to other malignant neoplasm: Secondary | ICD-10-CM

## 2024-06-12 DIAGNOSIS — Z1507 Genetic susceptibility to other malignant neoplasm of digestive system: Secondary | ICD-10-CM

## 2024-06-12 NOTE — Therapy (Signed)
 OUTPATIENT PHYSICAL THERAPY  UPPER EXTREMITY ONCOLOGY EVALUATION  Patient Name: Brianna Steele MRN: [id number] DOB:Jun 29, 1991, 33 y.o., female Today's Date: 06/12/2024  END OF SESSION:  PT End of Session - 06/12/24 0902     Visit Number 2    Number of Visits 12    Date for Recertification  07/13/24    PT Start Time 0903    PT Stop Time 0959    PT Time Calculation (min) 56 min    Activity Tolerance Patient tolerated treatment well    Behavior During Therapy [facility] for tasks assessed/performed          Past Medical History:  Diagnosis Date   BRCA2 gene mutation positive in female    Depression    Dermatofibroma of female breast 07/07/2019   Patient had a bump upper aspect of breast saw dermatology Dr. Junior shave biopsy shows dermatofibroma December 2020   Past Surgical History:  Procedure Laterality Date   CESAREAN SECTION     TONSILLECTOMY AND ADENOIDECTOMY  04/29/1998   TOTAL MASTECTOMY Bilateral 04/25/2024   Procedure: BILATERAL RISK REDUCING MASTECTOMIES;  Surgeon: Ebbie Cough, MD;  Location: [facility];  Service: General;  Laterality: Bilateral;   Patient Active Problem List   Diagnosis Date Noted   S/P bilateral mastectomy 04/25/2024   Genetic testing 03/08/2023   BRCA2 gene mutation positive 03/08/2023   Family history of BRCA2 gene positive 02/24/2023   Sinusitis 11/20/2021   Dermatofibroma of female breast 07/07/2019    PCP:   REFERRING PROVIDER: Cough Ebbie COME  REFERRING DIAG: s/p Bilateral Risk Reducing Mastectomies  THERAPY DIAG:  BRCA2 positive  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 My ROM has gotten better and it is more comfortable to do things, but I still have a long way to go. I drove myself today. The steri strips are off now but scabs remain. I have not tried the pillow under my arm yet.  EVAL Pt presents s/p bilateral Risk reducing Mastectomies due to BRCA2 mutation. Pain is pretty low. More soreness, tightness, dscomfort, pressure. If eel some fluid under my right arm. I see Dr. Ebbie Jun 09, 2024. Did the  exercises for the first time yesterday because prior to that she did them until she had a leak from her incision  PERTINENT HISTORY:  Pt  was seen for a [facility] cancer genetics consultation due to her mother's recent genetic testing which showed a BRCA2 mutation . She was also found to be positive for BRCA 2 positive gene mutation and underwent bilateral risk reducing mastectomies on 04/25/2024.  PAIN:  Are you having pain? No, soreness and pressure in axillas and chest, but not really pain. NPRS scale: 0/10 Pain location: bilateral shoulder ROM Pain orientation: Bilateral  PAIN TYPE: tight Pain description: intermittent  Aggravating factors: reaching, trying to sleep at times Relieving factors: resting it.  PRECAUTIONS: allergies to some adhesives,   RED FLAGS: None   WEIGHT BEARING RESTRICTIONS: No  FALLS:  Has patient fallen in last 6 months? No  LIVING ENVIRONMENT: Lives with: lives with their family, husband and twin girls Lives in: House/apartment    OCCUPATION: doesn't work outside the home  LEISURE: doing things with family, crafts  HAND DOMINANCE: right   PRIOR LEVEL OF FUNCTION: Independent  PATIENT GOALS:  Get ROM back and decrease tightness   OBJECTIVE: Note: Objective measures were completed at Evaluation unless otherwise noted.  COGNITION: Overall  cognitive status: Within functional limits for tasks assessed   PALPATION: Tenderness bilateral UT, chest  OBSERVATIONS / OTHER ASSESSMENTS: Steri strips still present over incisions; large scab right breast incision, multiple small scabs present along incisions under streri strips,significant swelling right axillary region greater than left, Bilateral hands significantly dry skin  SENSATION: Light touch: Deficits     POSTURE: forward head, rounded shoulders  UPPER EXTREMITY AROM/PROM:  A/PROM RIGHT   eval   Shoulder extension 34,  Shoulder flexion 119  Shoulder abduction 88  Shoulder internal rotation   Shoulder external rotation     (Blank rows = not tested)  A/PROM LEFT   eval  Shoulder extension 38  Shoulder flexion 125  Shoulder abduction 89  Shoulder internal rotation   Shoulder external rotation     (Blank rows = not tested)  CERVICAL AROM: All within normal limits:    UPPER EXTREMITY STRENGTH:   LYMPHEDEMA ASSESSMENTS:   SURGERY TYPE/DATE: 04/25/2024 Bilateral risk reducing mastectomies for BRCA II  NUMBER OF LYMPH NODES REMOVED: 0  CHEMOTHERAPY: NONE  RADIATION:none  HORMONE TREATMENT: NONE  INFECTIONS: NO       QUICK DASH SURVEY: 43%                                                                                                                            TREATMENT DATE:   06/12/2024 Checked incisions. Multiple scabs still present. Instructed in scar massage avoiding crusted scab area and discussed Vit E, or coconut oil Pulleys x 2;30 sec flexion and scaption bilateral Supine wand flexion and scaption x 5 Supine stargazer x 5 ea arm t STM to bilateral UT, pectorals and lateral trunk with cocoa  butter MLD to right lateral trunk; activated groing LN's in right groin and right axillary pathway directing right pectoral/axillary swelling toward right groin. Instructed pt briefly in light pressure with gentle stretch. Updated HEP with wand  exs. NEXT; lat stretch, pec wall stretch  06/01/2024 Pt given post op pillow to use under the right arm for axillary swelling. Half inch foam pads in TG soft made to place in bilateral axillary borders of bra to try and keep over area of swelling. Instructed pt in all post op exercises x 5 reps ea and explained importance of her doing atleast 2 times per day to improve motion. Showed flexion and stargazer in supine.    PATIENT EDUCATION:  Education details: Pt given post op pillow to use under the right arm for swelling. Half inch foam pads in TG soft made to place in bilateral axillary borders of bra to try and keep over area of swelling. Instructed pt in all post op exercises x 5 reps ea and explained importance of her doing atleast 2 times per day to improve motion. Showed flexion and stargazer in supine. Person educated: Patient and Spouse Education method: Medical Illustrator, has handout from MD Education comprehension: verbalized understanding and returned demonstration  HOME EXERCISE PROGRAM: 4 post op exercises with flexion and stargazer in supine  ASSESSMENT:  CLINICAL IMPRESSION: Pt has not been getting her stretches in 2 times per day, and the importance of this was emphasized to her. Pt continues to feel tightness in bilateral rib cage and axillary regions. Swelling still noted right greater than left axilla. Pt has been able to drive herself and do light activities around her home.  EVAL Patient is a 33 y.o. female who was seen today for physical therapy evaluation and treatment s/p Bilateral Risk Reducing Mastectomies on 04/25/2024. She presents with pain/ tightness/ and limitations in bilateral shoulder ROM and strength. She has significant right axillary swelling and mild left axillary swelling. Postural adaptations present s/p surgery. She was instructed in, and performed all post op exercises and the importance of doing 2-3 times per day was emphasized. Her  incisions are still covered with steri stripss with some scabbing still present. She will benefit from exercise, manual soft tissue mobilization, MLD, and progression to strength.She will benefit from skilled therapy to address deficits noted and return pt to prior level of function   OBJECTIVE IMPAIRMENTS: decreased activity tolerance, decreased knowledge of condition, decreased mobility, decreased ROM, decreased strength, increased edema, impaired sensation, impaired UE functional use, postural dysfunction, and pain.   ACTIVITY LIMITATIONS: carrying, lifting, sleeping, bed mobility, reach over head, hygiene/grooming, caring for others, and driving  PARTICIPATION LIMITATIONS: cleaning, laundry, driving, community activity, and yard work  PERSONAL FACTORS: Bilateral Mastectomy for BRCA2+ are also affecting patient's functional outcome.   REHAB POTENTIAL: Excellent  CLINICAL DECISION MAKING: Stable/uncomplicated  EVALUATION COMPLEXITY: Low  GOALS: Goals reviewed with patient? Yes  SHORT TERM GOALS=LONG TERM GOALS: Target date: 07/14/2023  Pt will be independent with HEP for shoulder ROM and strengthening Baseline: Goal status: INITIAL  2.  Pt will improve bilateral shoulder ROM to atleast 140 for flexion and 160 for abd Baseline:  Goal status: INITIAL  3.  Pt will note decreased swelling at right and left axillary regions by atleast 50% Baseline:  Goal status: INITIAL  4.  Pt will be independent with self MLD to decrease swelling Baseline:  Goal status: INITIAL  5.  Pt will be tolerant of light strengthening Baseline:  Goal status:  INITIAL  6.  Quick dash will be no greater than 15% Baseline:  Goal status: INITIAL  PLAN:  PT FREQUENCY: 2x/week, may decrease to 1x if doing well  PT DURATION: 6 weeks  PLANNED INTERVENTIONS: 97164- PT Re-evaluation, 97750- Physical Performance Testing, 97110-Therapeutic exercises, 97530- Therapeutic activity, 97112- Neuromuscular  re-education, 97535- Self Care, 02859- Manual therapy, 20560 (1-2 muscles), 20561 (3+ muscles)- Dry Needling, Therapeutic exercises, Therapeutic activity, Neuromuscular re-education, Gait training, and Self Care  PLAN FOR NEXT SESSION: exs 2-3 times per day?lat stretch, pec wall stretch, review MLD, pulleys,supine wand, STM, PROM, MLD, review scar massage  Grayce JINNY Sheldon, PT 06/12/2024, 10:01 AM

## 2024-06-12 NOTE — Patient Instructions (Signed)
 SHOULDER: Flexion - Supine (Cane)        Cancer Rehab [phone]    Hold cane in both hands. Raise arms up overhead. Do not allow back to arch. Hold _5__ seconds. Do __5__ times; __2__ times a day.  Hands shoulder width Hands wider than shoulder width; V position   Shoulder Blade Stretch    Clasp fingers behind head with elbows touching in front of face. Pull elbows back while pressing shoulder blades together. Relax and hold as tolerated, can place pillow under elbow here for comfort as needed and to allow for prolonged stretch.  Repeat __5__ times. Do __2__ sessions per day.      Copyright  VHI. All rights reserved.

## 2024-06-14 ENCOUNTER — Ambulatory Visit

## 2024-06-14 DIAGNOSIS — M25611 Stiffness of right shoulder, not elsewhere classified: Secondary | ICD-10-CM

## 2024-06-14 DIAGNOSIS — M25612 Stiffness of left shoulder, not elsewhere classified: Secondary | ICD-10-CM

## 2024-06-14 DIAGNOSIS — Z15068 Genetic susceptibility to other malignant neoplasm of digestive system: Secondary | ICD-10-CM

## 2024-06-14 DIAGNOSIS — R600 Localized edema: Secondary | ICD-10-CM

## 2024-06-14 DIAGNOSIS — Z1501 Genetic susceptibility to malignant neoplasm of breast: Secondary | ICD-10-CM | POA: Diagnosis not present

## 2024-06-14 DIAGNOSIS — Z1509 Genetic susceptibility to other malignant neoplasm: Secondary | ICD-10-CM

## 2024-06-14 NOTE — Therapy (Addendum)
 " OUTPATIENT PHYSICAL THERAPY  UPPER EXTREMITY ONCOLOGY EVALUATION  Patient Name: Brianna Steele MRN: [id number] DOB:[birth year], 33 y.o., female Today's Date: 06/14/2024  END OF SESSION:  PT End of Session - 06/14/24 0854     Visit Number 3    Number of Visits 12    Date for Recertification  07/13/24    PT Start Time 0903    PT Stop Time 1001    PT Time Calculation (min) 58 min    Activity Tolerance Patient tolerated treatment well    Behavior During Therapy [facility] for tasks assessed/performed          Past Medical History:  Diagnosis Date   BRCA2 gene mutation positive in female    Depression    Dermatofibroma of female breast 07/07/2019   Patient had a bump upper aspect of breast saw dermatology Dr. Junior shave biopsy shows dermatofibroma December 2020   Past Surgical History:  Procedure Laterality Date   CESAREAN SECTION     TONSILLECTOMY AND ADENOIDECTOMY  04/29/1998   TOTAL MASTECTOMY Bilateral 04/25/2024   Procedure: BILATERAL RISK REDUCING MASTECTOMIES;  Surgeon: Ebbie Cough, MD;  Location: [facility];  Service: General;  Laterality: Bilateral;   Patient Active Problem List   Diagnosis Date Noted   S/P bilateral mastectomy 04/25/2024   Genetic testing 03/08/2023   BRCA2 gene mutation positive 03/08/2023   Family history of BRCA2 gene positive 02/24/2023   Sinusitis 11/20/2021   Dermatofibroma of female breast 07/07/2019    PCP:   REFERRING PROVIDER: Cough Ebbie COME  REFERRING DIAG: s/p Bilateral Risk Reducing Mastectomies  THERAPY DIAG:  BRCA2 positive  Genetic susceptibility to other malignant neoplasm  Stiffness of right shoulder, not elsewhere classified  Stiffness of left shoulder, not elsewhere classified  Localized edema  ONSET DATE: 04/25/2024  Rationale for Evaluation and Treatment: Rehabilitation  SUBJECTIVE:                                                                                                                                                                                            SUBJECTIVE STATEMENT:  I did fine after last visit. I did increase the number of times I did the exercises. No present pain. I got the pulleys for home. Putting on a jacket is still a little hard  EVAL Pt presents s/p bilateral Risk reducing Mastectomies due to BRCA2 mutation. Pain is pretty low. More soreness, tightness, dscomfort, pressure. If eel some fluid under my right arm. I see Dr. Ebbie Jun 09, 2024. Did the exercises for the first time yesterday because prior to that she did them until she had a leak  from her incision  PERTINENT HISTORY:  Pt  was seen for a [facility] cancer genetics consultation due to her mother's recent genetic testing which showed a BRCA2 mutation . She was also found to be positive for BRCA 2 positive gene mutation and underwent bilateral risk reducing mastectomies on 04/25/2024.  PAIN:  Are you having pain? No, soreness and pressure in axillas and chest, but not really pain. NPRS scale: 0/10 Pain location: bilateral shoulder ROM Pain orientation: Bilateral  PAIN TYPE: tight Pain description: intermittent  Aggravating factors: reaching, trying to sleep at times Relieving factors: resting it.  PRECAUTIONS: allergies to some adhesives,   RED FLAGS: None   WEIGHT BEARING RESTRICTIONS: No  FALLS:  Has patient fallen in last 6 months? No  LIVING ENVIRONMENT: Lives with: lives with their family, husband and twin girls Lives in: House/apartment    OCCUPATION: doesn't work outside the home  LEISURE: doing things with family, crafts  HAND DOMINANCE: right   PRIOR LEVEL OF FUNCTION: Independent  PATIENT GOALS:  Get ROM back and decrease tightness   OBJECTIVE: Note: Objective measures were completed at Evaluation unless otherwise noted.  COGNITION: Overall cognitive status: Within functional limits for tasks assessed   PALPATION: Tenderness bilateral UT,  chest  OBSERVATIONS / OTHER ASSESSMENTS: Steri strips still present over incisions; large scab right breast incision, multiple small scabs present along incisions under streri strips,significant swelling right axillary region greater than left, Bilateral hands significantly dry skin  SENSATION: Light touch: Deficits     POSTURE: forward head, rounded shoulders  UPPER EXTREMITY AROM/PROM:  A/PROM RIGHT   eval   Shoulder extension 34,  Shoulder flexion 119  Shoulder abduction 88  Shoulder internal rotation   Shoulder external rotation     (Blank rows = not tested)  A/PROM LEFT   eval  Shoulder extension 38  Shoulder flexion 125  Shoulder abduction 89  Shoulder internal rotation   Shoulder external rotation     (Blank rows = not tested)  CERVICAL AROM: All within normal limits:    UPPER EXTREMITY STRENGTH:   LYMPHEDEMA ASSESSMENTS:   SURGERY TYPE/DATE: 04/25/2024 Bilateral risk reducing mastectomies for BRCA II  NUMBER OF LYMPH NODES REMOVED: 0  CHEMOTHERAPY: NONE  RADIATION:none  HORMONE TREATMENT: NONE  INFECTIONS: NO       QUICK DASH SURVEY: 43%                                                                                                                            TREATMENT DATE:   06/14/2024 Pulleys shoulder flexion and abduction x 3 min Ball rolls on wall x 5 flex and 3 B abd Standing lat stretch x 3 at laundry cart Wall corner stretch for pecs x 3, 20 sec Single arm doorway stretch x 2, 20 sec LTR with arms outstretched PROM bilateral shoulder flexion, scaption, abd, ER;multiple VC's required to relax MLD to right lateral trunk; activated short neck,LN's in right groin and  right axillary pathway directing right pectoral/axillary swelling toward right groin. Repeated multiple times and ended with LN's also included diaphragmatic breathing after pt instruction Updated HEP with new HEP IR behind back nearly WNL 06/12/2024 Checked incisions.  Multiple scabs still present. Instructed in scar massage avoiding crusted scab area and discussed Vit E, or coconut oil Pulleys x 2;30 sec flexion and scaption bilateral Supine wand flexion and scaption x 5 Supine stargazer x 5 ea arm t STM to bilateral UT, pectorals and lateral trunk with cocoa butter MLD to right lateral trunk; activated groing LN's in right groin and right axillary pathway directing right pectoral/axillary swelling toward right groin. Instructed pt briefly in light pressure with gentle stretch. Updated HEP with wand exs. NEXT; lat stretch, pec wall stretch  06/01/2024 Pt given post op pillow to use under the right arm for axillary swelling. Half inch foam pads in TG soft made to place in bilateral axillary borders of bra to try and keep over area of swelling. Instructed pt in all post op exercises x 5 reps ea and explained importance of her doing atleast 2 times per day to improve motion. Showed flexion and stargazer in supine.    PATIENT EDUCATION:  Access Code: [email or web address] URL: [email or web address] Date: 06/14/2024 Prepared by: Grayce Sheldon  Exercises - Standing 'L' Stretch at Counter  - 1 x daily - 7 x weekly - 3 sets - 10 reps - Corner Pec Major Stretch  - 1 x daily - 7 x weekly - 3 sets - 10 reps - Single Arm Doorway Pec Stretch at 90 Degrees Abduction  - 1 x daily - 7 x weekly - 3 sets - 10 reps - Supine Lower Trunk Rotation  - 1 x daily - 7 x weekly - 3 sets - 10 reps Education details: Pt given post op pillow to use under the right arm for swelling. Half inch foam pads in TG soft made to place in bilateral axillary borders of bra to try and keep over area of swelling. Instructed pt in all post op exercises x 5 reps ea and explained importance of her doing atleast 2 times per day to improve motion. Showed flexion and stargazer in supine. Person educated: Patient and Spouse Education method: Medical Illustrator, has handout from [employer] Education  comprehension: verbalized understanding and returned demonstration  HOME EXERCISE PROGRAM: 4 post op exercises with flexion and stargazer in supine  ASSESSMENT:  CLINICAL IMPRESSION: Pt did very well with new exercises instructed and they were added to her HEP. Noted improvement after starting exercises 2x's per day. Pt has pulleys at home that she will start using. She does not relax well for PROM and requires multiple VC's   EVAL Patient is a 33 y.o. female who was seen today for physical therapy evaluation and treatment s/p Bilateral Risk Reducing Mastectomies on 04/25/2024. She presents with pain/ tightness/ and limitations in bilateral shoulder ROM and strength. She has significant right axillary swelling and mild left axillary swelling. Postural adaptations present s/p surgery. She was instructed in, and performed all post op exercises and the importance of doing 2-3 times per day was emphasized. Her incisions are still covered with steri stripss with some scabbing still present. She will benefit from exercise, manual soft tissue mobilization, MLD, and progression to strength.She will benefit from skilled therapy to address deficits noted and return pt to prior level of function   OBJECTIVE IMPAIRMENTS: decreased activity tolerance, decreased knowledge of condition, decreased mobility, decreased ROM, decreased strength,  increased edema, impaired sensation, impaired UE functional use, postural dysfunction, and pain.   ACTIVITY LIMITATIONS: carrying, lifting, sleeping, bed mobility, reach over head, hygiene/grooming, caring for others, and driving  PARTICIPATION LIMITATIONS: cleaning, laundry, driving, community activity, and yard work  PERSONAL FACTORS: Bilateral Mastectomy for BRCA2+ are also affecting patient's functional outcome.   REHAB POTENTIAL: Excellent  CLINICAL DECISION MAKING: Stable/uncomplicated  EVALUATION COMPLEXITY: Low  GOALS: Goals reviewed with patient?  Yes  SHORT TERM GOALS=LONG TERM GOALS: Target date: 07/14/2023  Pt will be independent with HEP for shoulder ROM and strengthening Baseline: Goal status: INITIAL  2.  Pt will improve bilateral shoulder ROM to atleast 140 for flexion and 160 for abd Baseline:  Goal status: INITIAL  3.  Pt will note decreased swelling at right and left axillary regions by atleast 50% Baseline:  Goal status: INITIAL  4.  Pt will be independent with self MLD to decrease swelling Baseline:  Goal status: INITIAL  5.  Pt will be tolerant of light strengthening Baseline:  Goal status: INITIAL  6.  Quick dash will be no greater than 15% Baseline:  Goal status: INITIAL  PLAN:  PT FREQUENCY: 2x/week, may decrease to 1x if doing well  PT DURATION: 6 weeks  PLANNED INTERVENTIONS: 97164- PT Re-evaluation, 97750- Physical Performance Testing, 97110-Therapeutic exercises, 97530- Therapeutic activity, 97112- Neuromuscular re-education, 97535- Self Care, 02859- Manual therapy, 20560 (1-2 muscles), 20561 (3+ muscles)- Dry Needling, Therapeutic exercises, Therapeutic activity, Neuromuscular re-education, Gait training, and Self Care  PLAN FOR NEXT SESSION: exs 2-3 times per day?lat stretch, pec wall stretch, review MLD, pulleys,supine wand, STM, PROM, MLD, review scar massage PHYSICAL THERAPY DISCHARGE SUMMARY  Visits from Start of Care: 3  Current functional level related to goals / functional outcomes: Improving but goal achievement unknown. Did not return but called to say she was doing well and could be discharged   Remaining deficits: Unknown   Education / Equipment: HEP for ROM, MLD for swelling   Patient agrees to discharge. Patient goals were not assessed. Patient is being discharged due to calling to say she is doing well and felt ready to be discharged.  Grayce JINNY Sheldon, PT 06/14/2024, 10:13 AM  "

## 2024-06-20 ENCOUNTER — Ambulatory Visit: Admitting: Rehabilitation

## 2024-06-26 ENCOUNTER — Ambulatory Visit

## 2024-06-28 ENCOUNTER — Ambulatory Visit

## 2024-07-31 ENCOUNTER — Ambulatory Visit: Payer: Self-pay

## 2024-07-31 NOTE — Telephone Encounter (Signed)
 Patient has an appointment 08/02/24

## 2024-08-02 ENCOUNTER — Encounter: Payer: Self-pay | Admitting: Family Medicine

## 2024-08-02 ENCOUNTER — Ambulatory Visit: Admitting: Family Medicine

## 2024-08-02 VITALS — BP 110/75 | HR 71 | Temp 98.4°F | Ht 61.0 in | Wt 139.0 lb

## 2024-08-02 DIAGNOSIS — R21 Rash and other nonspecific skin eruption: Secondary | ICD-10-CM | POA: Diagnosis not present

## 2024-08-02 MED ORDER — KETOCONAZOLE 2 % EX CREA: 1.0000 | TOPICAL_CREAM | Freq: Two times a day (BID) | CUTANEOUS | 4 refills | Status: DC

## 2024-08-02 NOTE — Progress Notes (Signed)
" ° °  Subjective:    Patient ID: Brianna Steele, female    DOB: 03/31/1991, 34 y.o.   MRN: [id number]  HPI  Rash under armpit area and at chest area , recent double mastectomy 03/2024 has been going for a month  Was prescribed triamcinolone  by online [employer]  She showed me some pictures from a cell phone showed a leading red edge with central clearing No sign of any abscess or pus drainage Review of Systems     Objective:   Physical Exam Erythematous rash under both arms Head leading edge to it with central clearing No scaling Soft tissue swelling noted in the right axilla Mid chest near surgical scar no sign of abscess or pus drainage bilateral mastectomy noted Nurse present for exam   Axilla no nodules     Assessment & Plan:  Commend stopping triamcinolone  Recommend ketoconazole  to cover for the possibility of yeast Recommend nonirritating deodorant Referral to dermatology she had genetic testing previously which showed a high predilection towards skin cancers and she would like to have a dermatologist established She will follow-up with her cancer doctor regarding the breast mastectomy so they can look at both sides No sign of any reoccurrence currently  "

## 2024-08-03 MED ORDER — KETOCONAZOLE 2 % EX CREA: TOPICAL_CREAM | CUTANEOUS | 7 refills | Status: AC

## 2024-08-03 NOTE — Addendum Note (Signed)
 Addended by: ALPHONSA HAMILTON A on: 08/03/2024 03:12 PM   Modules accepted: Orders

## 2024-08-10 ENCOUNTER — Ambulatory Visit: Admitting: Dermatology

## 2025-04-16 ENCOUNTER — Inpatient Hospital Stay: Admitting: Oncology
